# Patient Record
Sex: Male | Born: 1970 | Race: White | Hispanic: No | Marital: Single | Smoking: Never smoker
Health system: Southern US, Community
[De-identification: ages and names within clinical notes are randomized; demographics above are authoritative.]

## PROBLEM LIST (undated history)

## (undated) DIAGNOSIS — F1011 Alcohol abuse, in remission: Secondary | ICD-10-CM

---

## 1998-02-15 HISTORY — PX: KNEE SURGERY: SHX244

## 2009-01-29 ENCOUNTER — Emergency Department (HOSPITAL_COMMUNITY): Admission: EM | Admit: 2009-01-29 | Discharge: 2009-01-29 | Payer: Self-pay | Admitting: Emergency Medicine

## 2010-05-19 LAB — POCT I-STAT, CHEM 8
BUN: 6 mg/dL (ref 6–23)
Calcium, Ion: 1.05 mmol/L — ABNORMAL LOW (ref 1.12–1.32)
Chloride: 108 mEq/L (ref 96–112)
Creatinine, Ser: 0.6 mg/dL (ref 0.4–1.5)
Glucose, Bld: 103 mg/dL — ABNORMAL HIGH (ref 70–99)
HCT: 45 % (ref 39.0–52.0)
Hemoglobin: 15.3 g/dL (ref 13.0–17.0)
Potassium: 4.3 mEq/L (ref 3.5–5.1)
Sodium: 142 mEq/L (ref 135–145)
TCO2: 22 mmol/L (ref 0–100)

## 2013-05-10 ENCOUNTER — Telehealth: Payer: Self-pay | Admitting: Family Medicine

## 2013-05-10 NOTE — Telephone Encounter (Signed)
yes

## 2013-05-10 NOTE — Telephone Encounter (Signed)
Mom states she is a pt of yours and father is also. Would like to know if you would accept pt (their son)  as a pt?

## 2013-05-11 NOTE — Telephone Encounter (Signed)
appt scheduled

## 2013-05-17 ENCOUNTER — Encounter: Payer: Self-pay | Admitting: Family Medicine

## 2013-05-17 ENCOUNTER — Ambulatory Visit (INDEPENDENT_AMBULATORY_CARE_PROVIDER_SITE_OTHER): Payer: PRIVATE HEALTH INSURANCE | Admitting: Family Medicine

## 2013-05-17 VITALS — BP 140/90 | HR 112 | Temp 98.3°F | Ht 69.5 in | Wt 227.0 lb

## 2013-05-17 DIAGNOSIS — F524 Premature ejaculation: Secondary | ICD-10-CM

## 2013-05-17 DIAGNOSIS — R030 Elevated blood-pressure reading, without diagnosis of hypertension: Secondary | ICD-10-CM

## 2013-05-17 DIAGNOSIS — IMO0001 Reserved for inherently not codable concepts without codable children: Secondary | ICD-10-CM

## 2013-05-17 MED ORDER — FLUOXETINE HCL 10 MG PO CAPS: 10.0000 mg | ORAL_CAPSULE | Freq: Every day | ORAL | Status: DC

## 2013-05-17 NOTE — Progress Notes (Signed)
   Subjective:    Patient ID: Aaron Wilkerson, male    DOB: 10/07/1970, 43 y.o.   MRN: [id number]  HPI Patient is seen to establish care. Has not had a primary care physician in several years. No physical since he was in high school. He takes no regular medications. His fianc had recently been checking blood pressures and he had apparently systolic of 200 on one occasion. He's not had any headaches but has had some recent nosebleeds. He does not smoke but uses smokeless tobacco.  Patient does consume beer most days and generally 2-3 per day and occasionally binges up to 8 or 12 per day.  He does not consume much in the way of caffeine.  Other concern is issues with premature ejaculation. States about 6 years ago during intercourse felt a "popping sensation" in his penis and had somewhat of an altered in shape his penis early during intercourse but eventually this improved. He is not describing any classic Peyronie's disease. Does not describe any pain with intercourse. His major concern is premature ejaculation which has worsened over the past 2 years.  History reviewed. No pertinent past medical history. History reviewed. No pertinent past surgical history.  reports that he has never smoked. His smokeless tobacco use includes Chew. He reports that he drinks alcohol. He reports that he does not use illicit drugs. family history includes Heart disease in his maternal grandfather and maternal grandmother; Hyperlipidemia in his father; Hypertension in his father and mother. No Known Allergies    Review of Systems  Constitutional: Negative for fatigue.  HENT: Positive for nosebleeds.   Eyes: Negative for visual disturbance.  Respiratory: Negative for cough, chest tightness and shortness of breath.   Cardiovascular: Negative for chest pain, palpitations and leg swelling.  Neurological: Negative for dizziness, syncope, weakness, light-headedness and headaches.       Objective:   Physical Exam   Constitutional: He appears well-developed and well-nourished.  Neck: Neck supple. No thyromegaly present.  Cardiovascular: Normal rate and regular rhythm.   Pulmonary/Chest: Effort normal and breath sounds normal. No respiratory distress. He has no wheezes. He has no rales.  Musculoskeletal: He exhibits no edema.          Assessment & Plan:  #1 elevated blood pressure. Patient may have hypertension but we are not confirming readings comparable he has got a home recently. Continue to monitor closely next month. Work on weight loss. Reduce alcohol consumption. Reduce sodium consumption. Reassess with physical in one month.  #2 history of premature ejaculation. Trial of Prozac 10 mg once daily. Review at followup  #3 health maintenance.  Needs CPE and he will schedule.

## 2013-05-17 NOTE — Progress Notes (Signed)
Pre visit review using our clinic review tool, if applicable. No additional management support is needed unless otherwise documented below in the visit note. 

## 2013-05-17 NOTE — Patient Instructions (Signed)
Schedule complete physical. Try to lose some weight Scale back alcohol to no more than 2 beers per day.  Managing Your High Blood Pressure Blood pressure is a measurement of how forceful your blood is pressing against the walls of the arteries. Arteries are muscular tubes within the circulatory system. Blood pressure does not stay the same. Blood pressure rises when you are active, excited, or nervous; and it lowers during sleep and relaxation. If the numbers measuring your blood pressure stay above normal most of the time, you are at risk for health problems. High blood pressure (hypertension) is a long-term (chronic) condition in which blood pressure is elevated. A blood pressure reading is recorded as two numbers, such as 120 over 80 (or 120/80). The first, higher number is called the systolic pressure. It is a measure of the pressure in your arteries as the heart beats. The second, lower number is called the diastolic pressure. It is a measure of the pressure in your arteries as the heart relaxes between beats.  Keeping your blood pressure in a normal range is important to your overall health and prevention of health problems, such as heart disease and stroke. When your blood pressure is uncontrolled, your heart has to work harder than normal. High blood pressure is a very common condition in adults because blood pressure tends to rise with age. Men and women are equally likely to have hypertension but at different times in life. Before age 12, men are more likely to have hypertension. After 43 years of age, women are more likely to have it. Hypertension is especially common in African Americans. This condition often has no signs or symptoms. The cause of the condition is usually not known. Your caregiver can help you come up with a plan to keep your blood pressure in a normal, healthy range. BLOOD PRESSURE STAGES Blood pressure is classified into four stages: normal, prehypertension, stage 1, and stage  2. Your blood pressure reading will be used to determine what type of treatment, if any, is necessary. Appropriate treatment options are tied to these four stages:  Normal  Systolic pressure (mm Hg): below 120.  Diastolic pressure (mm Hg): below 80. Prehypertension  Systolic pressure (mm Hg): 120 to 139.  Diastolic pressure (mm Hg): 80 to 89. Stage1  Systolic pressure (mm Hg): 140 to 159.  Diastolic pressure (mm Hg): 90 to 99. Stage2  Systolic pressure (mm Hg): 160 or above.  Diastolic pressure (mm Hg): 100 or above. RISKS RELATED TO HIGH BLOOD PRESSURE Managing your blood pressure is an important responsibility. Uncontrolled high blood pressure can lead to:  A heart attack.  A stroke.  A weakened blood vessel (aneurysm).  Heart failure.  Kidney damage.  Eye damage.  Metabolic syndrome.  Memory and concentration problems. HOW TO MANAGE YOUR BLOOD PRESSURE Blood pressure can be managed effectively with lifestyle changes and medicines (if needed). Your caregiver will help you come up with a plan to bring your blood pressure within a normal range. Your plan should include the following: Education  Read all information provided by your caregivers about how to control blood pressure.  Educate yourself on the latest guidelines and treatment recommendations. New research is always being done to further define the risks and treatments for high blood pressure. Lifestylechanges  Control your weight.  Avoid smoking.  Stay physically active.  Reduce the amount of salt in your diet.  Reduce stress.  Control any chronic conditions, such as high cholesterol or diabetes.  Reduce your  alcohol intake. Medicines  Several medicines (antihypertensive medicines) are available, if needed, to bring blood pressure within a normal range. Communication  Review all the medicines you take with your caregiver because there may be side effects or interactions.  Talk with your  caregiver about your diet, exercise habits, and other lifestyle factors that may be contributing to high blood pressure.  See your caregiver regularly. Your caregiver can help you create and adjust your plan for managing high blood pressure. RECOMMENDATIONS FOR TREATMENT AND FOLLOW-UP  The following recommendations are based on current guidelines for managing high blood pressure in nonpregnant adults. Use these recommendations to identify the proper follow-up period or treatment option based on your blood pressure reading. You can discuss these options with your caregiver.  Systolic pressure of 120 to 139 or diastolic pressure of 80 to 89: Follow up with your caregiver as directed.  Systolic pressure of 140 to 160 or diastolic pressure of 90 to 100: Follow up with your caregiver within 2 months.  Systolic pressure above 160 or diastolic pressure above 100: Follow up with your caregiver within 1 month.  Systolic pressure above 180 or diastolic pressure above 110: Consider antihypertensive therapy; follow up with your caregiver within 1 week.  Systolic pressure above 200 or diastolic pressure above 120: Begin antihypertensive therapy; follow up with your caregiver within 1 week. Document Released: 10/27/2011 Document Reviewed: 10/27/2011 [facility] Patient Information 2014 [employer], [place].

## 2013-05-19 DIAGNOSIS — I10 Essential (primary) hypertension: Secondary | ICD-10-CM | POA: Insufficient documentation

## 2013-05-21 ENCOUNTER — Telehealth: Payer: Self-pay | Admitting: Family Medicine

## 2013-05-21 NOTE — Telephone Encounter (Signed)
Relevant patient education mailed to patient.  

## 2013-06-25 ENCOUNTER — Encounter: Payer: Self-pay | Admitting: Family Medicine

## 2013-06-25 ENCOUNTER — Ambulatory Visit (INDEPENDENT_AMBULATORY_CARE_PROVIDER_SITE_OTHER): Payer: PRIVATE HEALTH INSURANCE | Admitting: Family Medicine

## 2013-06-25 VITALS — BP 138/88 | HR 103 | Wt 226.0 lb

## 2013-06-25 DIAGNOSIS — F524 Premature ejaculation: Secondary | ICD-10-CM

## 2013-06-25 MED ORDER — FLUOXETINE HCL 20 MG PO TABS: 20.0000 mg | ORAL_TABLET | Freq: Every day | ORAL | Status: DC

## 2013-06-25 NOTE — Progress Notes (Signed)
Pre visit review using our clinic review tool, if applicable. No additional management support is needed unless otherwise documented below in the visit note. 

## 2013-06-25 NOTE — Patient Instructions (Signed)
Titrate Fluoxetine up to 2 tablets per day .   Try to lose some weight Continue to monitor blood pressure and be in touch if consistently > 140/90.

## 2013-06-25 NOTE — Progress Notes (Signed)
   Subjective:    Patient ID: Aaron Wilkerson, male    DOB: 1970-11-03, 43 y.o.   MRN: [id number]  HPI Patient seen for reevaluation regarding elevated blood pressure and premature ejaculation. We started fluoxetine 10 mg once daily and this has not helped his premature ejaculation symptoms. He denies any medication side effects. Not monitoring blood pressures regularly.  He has scaled back alcohol somewhat but still drinking about 4-5 beers every other day. No headaches. His wife is a Engineer, civil (consulting)nurse and she has been monitoring blood pressures periodically.  No past medical history on file. No past surgical history on file.  reports that he has never smoked. His smokeless tobacco use includes Chew. He reports that he drinks alcohol. He reports that he does not use illicit drugs. family history includes Heart disease in his maternal grandfather and maternal grandmother; Hyperlipidemia in his father; Hypertension in his father and mother. No Known Allergies    Review of Systems  Constitutional: Negative for fatigue.  Eyes: Negative for visual disturbance.  Respiratory: Negative for cough, chest tightness and shortness of breath.   Cardiovascular: Negative for chest pain, palpitations and leg swelling.  Neurological: Negative for dizziness, syncope, weakness, light-headedness and headaches.       Objective:   Physical Exam  Constitutional: He is oriented to person, place, and time. He appears well-developed and well-nourished.  HENT:  Right Ear: External ear normal.  Left Ear: External ear normal.  Mouth/Throat: Oropharynx is clear and moist.  Eyes: Pupils are equal, round, and reactive to light.  Neck: Neck supple. No thyromegaly present.  Cardiovascular: Normal rate and regular rhythm.   Pulmonary/Chest: Effort normal and breath sounds normal. No respiratory distress. He has no wheezes. He has no rales.  Musculoskeletal: He exhibits no edema.  Neurological: He is alert and oriented to  person, place, and time.          Assessment & Plan:  #1 elevated blood pressure. Borderline elevation. We've suggested continued monitoring, weight loss, scaled back alcohol further. Reassess 3 months. #2 premature ejaculation. Titrate fluoxetine 20 mg once daily.

## 2015-11-04 ENCOUNTER — Emergency Department (HOSPITAL_COMMUNITY)
Admission: EM | Admit: 2015-11-04 | Discharge: 2015-11-04 | Disposition: A | Discharge status: Home / Self Care | Payer: BLUE CROSS/BLUE SHIELD | Attending: Emergency Medicine | Admitting: Emergency Medicine

## 2015-11-04 ENCOUNTER — Encounter (HOSPITAL_COMMUNITY): Payer: Self-pay | Admitting: Emergency Medicine

## 2015-11-04 ENCOUNTER — Emergency Department (HOSPITAL_COMMUNITY): Payer: BLUE CROSS/BLUE SHIELD

## 2015-11-04 DIAGNOSIS — F17220 Nicotine dependence, chewing tobacco, uncomplicated: Secondary | ICD-10-CM | POA: Insufficient documentation

## 2015-11-04 DIAGNOSIS — R079 Chest pain, unspecified: Secondary | ICD-10-CM | POA: Diagnosis not present

## 2015-11-04 DIAGNOSIS — R0602 Shortness of breath: Secondary | ICD-10-CM | POA: Diagnosis not present

## 2015-11-04 DIAGNOSIS — F1010 Alcohol abuse, uncomplicated: Secondary | ICD-10-CM | POA: Insufficient documentation

## 2015-11-04 LAB — BASIC METABOLIC PANEL
Anion gap: 10 (ref 5–15)
BUN: 11 mg/dL (ref 6–20)
CO2: 23 mmol/L (ref 22–32)
Calcium: 9.5 mg/dL (ref 8.9–10.3)
Chloride: 106 mmol/L (ref 101–111)
Creatinine, Ser: 0.6 mg/dL — ABNORMAL LOW (ref 0.61–1.24)
GFR calc Af Amer: >60 mL/min (ref >60)
GFR calc non Af Amer: >60 mL/min (ref >60)
Glucose, Bld: 102 mg/dL — ABNORMAL HIGH (ref 65–99)
Potassium: 3.5 mmol/L (ref 3.5–5.1)
Sodium: 139 mmol/L (ref 135–145)

## 2015-11-04 LAB — ETHANOL: Alcohol, Ethyl (B): <5 mg/dL (ref <5)

## 2015-11-04 LAB — CBC
HCT: 44 % (ref 39.0–52.0)
Hemoglobin: 14.9 g/dL (ref 13.0–17.0)
MCH: 32.1 pg (ref 26.0–34.0)
MCHC: 33.9 g/dL (ref 30.0–36.0)
MCV: 94.8 fL (ref 78.0–100.0)
Platelets: 127 10*3/uL — ABNORMAL LOW (ref 150–400)
RBC: 4.64 MIL/uL (ref 4.22–5.81)
RDW: 11.9 % (ref 11.5–15.5)
WBC: 8 10*3/uL (ref 4.0–10.5)

## 2015-11-04 LAB — I-STAT TROPONIN, ED: Troponin i, poc: 0 ng/mL (ref 0.00–0.08)

## 2015-11-04 MED ORDER — PROMETHAZINE HCL 25 MG PO TABS: 25.0000 mg | ORAL_TABLET | Freq: Four times a day (QID) | ORAL | 0 refills | Status: DC | PRN

## 2015-11-04 MED ORDER — SODIUM CHLORIDE 0.9 % IV BOLUS (SEPSIS)
1000.0000 mL | Freq: Once | INTRAVENOUS | Status: AC
  Administered 2015-11-04: 1000 mL via INTRAVENOUS

## 2015-11-04 MED ORDER — LORAZEPAM 2 MG/ML IJ SOLN
1.0000 mg | Freq: Once | INTRAMUSCULAR | Status: AC
  Administered 2015-11-04: 1 mg via INTRAVENOUS
  Filled 2015-11-04: qty 1

## 2015-11-04 MED ORDER — LORAZEPAM 1 MG PO TABS: 1.0000 mg | ORAL_TABLET | Freq: Three times a day (TID) | ORAL | 0 refills | Status: DC | PRN

## 2015-11-04 MED ORDER — ONDANSETRON HCL 4 MG/2ML IJ SOLN
4.0000 mg | Freq: Once | INTRAMUSCULAR | Status: AC
  Administered 2015-11-04: 4 mg via INTRAVENOUS
  Filled 2015-11-04: qty 2

## 2015-11-04 NOTE — Discharge Instructions (Signed)
Strongly recommend Alcoholics Anonymous. Prescription for nerve and nausea medicine. Increase fluids. Eat a good balanced diet.

## 2015-11-04 NOTE — ED Triage Notes (Signed)
Pt c/o CP on exertion, shakiness, nausea, bile regurgitation, SOB. Pt usually drinks 24 beers per day, stopped on Sunday.

## 2015-11-04 NOTE — ED Provider Notes (Signed)
[facility]-EMERGENCY DEPT Provider Note   CSN: [id number] Arrival date & time: 11/04/15  1743     History   Chief Complaint Chief Complaint  Patient presents with  . Chest Pain  . Alcohol Problem    detox    HPI Aaron Wilkerson is a 45 y.o. male.  Patient has a long-term drinking problem. He drinks up to a case of beer a day. Last alcohol on Sunday. He claims having no other health problems. He feels agitated and nervous. No street drugs. Complains of vague chest pain.      History reviewed. No pertinent past medical history.  Patient Active Problem List   Diagnosis Date Noted  . Premature ejaculation 06/25/2013  . Elevated blood pressure 05/19/2013    History reviewed. No pertinent surgical history.     Home Medications    Prior to Admission medications   Medication Sig Start Date End Date Taking? Authorizing Provider  FLUoxetine (PROZAC) 20 MG tablet Take 1 tablet (20 mg total) by mouth daily. Patient not taking: Reported on 11/04/2015 06/25/13   Kristian CoveyBruce W Burchette, MD  LORazepam (ATIVAN) 1 MG tablet Take 1 tablet (1 mg total) by mouth 3 (three) times daily as needed for anxiety. 11/04/15   Donnetta HutchingBrian Lacresha Fusilier, MD  promethazine (PHENERGAN) 25 MG tablet Take 1 tablet (25 mg total) by mouth every 6 (six) hours as needed for nausea or vomiting. 11/04/15   Donnetta HutchingBrian Taylour Lietzke, MD    Family History Family History  Problem Relation Age of Onset  . Hypertension Mother   . Hypertension Father   . Hyperlipidemia Father   . Heart disease Maternal Grandmother   . Heart disease Maternal Grandfather     Social History Social History  Substance Use Topics  . Smoking status: Never Smoker  . Smokeless tobacco: Current User    Types: Chew  . Alcohol use 100.8 oz/week    168 Cans of beer per week     Allergies   Review of patient's allergies indicates no known allergies.   Review of Systems Review of Systems  All other systems reviewed and are negative.    Physical Exam Updated  Vital Signs BP 120/93   Pulse 71   Resp 13   SpO2 99%   Physical Exam  Constitutional: He is oriented to person, place, and time.  Slight tremulousness, but no acute distress  HENT:  Head: Normocephalic and atraumatic.  Eyes: Conjunctivae are normal.  Neck: Neck supple.  Cardiovascular: Normal rate and regular rhythm.   Pulmonary/Chest: Effort normal and breath sounds normal.  Abdominal: Soft. Bowel sounds are normal.  Musculoskeletal: Normal range of motion.  Neurological: He is alert and oriented to person, place, and time.  Skin: Skin is warm and dry.  Psychiatric: He has a normal mood and affect. His behavior is normal.  Nursing note and vitals reviewed.    ED Treatments / Results  Labs (all labs ordered are listed, but only abnormal results are displayed) Labs Reviewed  BASIC METABOLIC PANEL - Abnormal; Notable for the following:       Result Value   Glucose, Bld 102 (*)    Creatinine, Ser 0.60 (*)    All other components within normal limits  CBC - Abnormal; Notable for the following:    Platelets 127 (*)    All other components within normal limits  ETHANOL  I-STAT TROPOININ, ED    EKG  EKG Interpretation None       Radiology Dg Chest 2 View  Result Date: 11/04/2015 CLINICAL DATA:  Acute onset of generalized chest pain on exertion. Shakiness and nausea. Shortness of breath. Initial encounter. EXAM: CHEST  2 VIEW COMPARISON:  None. FINDINGS: The lungs are well-aerated and clear. There is no evidence of focal opacification, pleural effusion or pneumothorax. The heart is normal in size; the mediastinal contour is within normal limits. No acute osseous abnormalities are seen. IMPRESSION: No acute cardiopulmonary process seen. Electronically Signed   By: Roanna Raider M.D.   On: 11/04/2015 19:16    Procedures Procedures (including critical care time)  Medications Ordered in ED Medications  sodium chloride 0.9 % bolus 1,000 mL (1,000 mLs Intravenous New  Bag/Given 11/04/15 2000)  ondansetron (ZOFRAN) injection 4 mg (4 mg Intravenous Given 11/04/15 2003)  sodium chloride 0.9 % bolus 1,000 mL (1,000 mLs Intravenous New Bag/Given 11/04/15 2000)  LORazepam (ATIVAN) injection 1 mg (1 mg Intravenous Given 11/04/15 2005)     Initial Impression / Assessment and Plan / ED Course  I have reviewed the triage vital signs and the nursing notes.  Pertinent labs & imaging results that were available during my care of the patient were reviewed by me and considered in my medical decision making (see chart for details).  Clinical Course    Patient has a normal physical exam. Screening labs were within normal limits. He feels better after IV fluids and IV Zofran, IV Ativan. Discussed his drinking problem. Discharge medications Ativan 1 mg and Phenergan 25 mg  Final Clinical Impressions(s) / ED Diagnoses   Final diagnoses:  Alcohol abuse    New Prescriptions New Prescriptions   LORAZEPAM (ATIVAN) 1 MG TABLET    Take 1 tablet (1 mg total) by mouth 3 (three) times daily as needed for anxiety.   PROMETHAZINE (PHENERGAN) 25 MG TABLET    Take 1 tablet (25 mg total) by mouth every 6 (six) hours as needed for nausea or vomiting.     Donnetta Hutching, MD 11/04/15 [phone]

## 2015-11-27 DIAGNOSIS — Z23 Encounter for immunization: Secondary | ICD-10-CM | POA: Diagnosis not present

## 2016-03-03 ENCOUNTER — Encounter (HOSPITAL_COMMUNITY): Payer: Self-pay

## 2016-03-03 ENCOUNTER — Inpatient Hospital Stay (HOSPITAL_COMMUNITY)
Admission: EM | Admit: 2016-03-03 | Discharge: 2016-03-10 | DRG: 378 | Disposition: A | Discharge status: Home / Self Care | Payer: BLUE CROSS/BLUE SHIELD | Attending: Family Medicine | Admitting: Family Medicine

## 2016-03-03 DIAGNOSIS — K2980 Duodenitis without bleeding: Secondary | ICD-10-CM | POA: Diagnosis present

## 2016-03-03 DIAGNOSIS — G4730 Sleep apnea, unspecified: Secondary | ICD-10-CM | POA: Diagnosis not present

## 2016-03-03 DIAGNOSIS — S0990XA Unspecified injury of head, initial encounter: Secondary | ICD-10-CM | POA: Diagnosis not present

## 2016-03-03 DIAGNOSIS — Z8249 Family history of ischemic heart disease and other diseases of the circulatory system: Secondary | ICD-10-CM | POA: Diagnosis not present

## 2016-03-03 DIAGNOSIS — K648 Other hemorrhoids: Secondary | ICD-10-CM | POA: Diagnosis not present

## 2016-03-03 DIAGNOSIS — H5500 Unspecified nystagmus: Secondary | ICD-10-CM | POA: Diagnosis present

## 2016-03-03 DIAGNOSIS — K219 Gastro-esophageal reflux disease without esophagitis: Secondary | ICD-10-CM | POA: Diagnosis not present

## 2016-03-03 DIAGNOSIS — T39395A Adverse effect of other nonsteroidal anti-inflammatory drugs [NSAID], initial encounter: Secondary | ICD-10-CM | POA: Diagnosis not present

## 2016-03-03 DIAGNOSIS — F419 Anxiety disorder, unspecified: Secondary | ICD-10-CM

## 2016-03-03 DIAGNOSIS — K259 Gastric ulcer, unspecified as acute or chronic, without hemorrhage or perforation: Secondary | ICD-10-CM | POA: Diagnosis not present

## 2016-03-03 DIAGNOSIS — K649 Unspecified hemorrhoids: Secondary | ICD-10-CM | POA: Diagnosis not present

## 2016-03-03 DIAGNOSIS — R55 Syncope and collapse: Secondary | ICD-10-CM

## 2016-03-03 DIAGNOSIS — I10 Essential (primary) hypertension: Secondary | ICD-10-CM | POA: Diagnosis present

## 2016-03-03 DIAGNOSIS — K2960 Other gastritis without bleeding: Secondary | ICD-10-CM | POA: Diagnosis not present

## 2016-03-03 DIAGNOSIS — K5731 Diverticulosis of large intestine without perforation or abscess with bleeding: Secondary | ICD-10-CM

## 2016-03-03 DIAGNOSIS — R42 Dizziness and giddiness: Secondary | ICD-10-CM

## 2016-03-03 DIAGNOSIS — Z8719 Personal history of other diseases of the digestive system: Secondary | ICD-10-CM | POA: Diagnosis not present

## 2016-03-03 DIAGNOSIS — K625 Hemorrhage of anus and rectum: Secondary | ICD-10-CM

## 2016-03-03 DIAGNOSIS — K5730 Diverticulosis of large intestine without perforation or abscess without bleeding: Secondary | ICD-10-CM | POA: Diagnosis not present

## 2016-03-03 DIAGNOSIS — D62 Acute posthemorrhagic anemia: Secondary | ICD-10-CM | POA: Diagnosis not present

## 2016-03-03 DIAGNOSIS — K922 Gastrointestinal hemorrhage, unspecified: Secondary | ICD-10-CM | POA: Diagnosis not present

## 2016-03-03 DIAGNOSIS — K3189 Other diseases of stomach and duodenum: Secondary | ICD-10-CM | POA: Diagnosis not present

## 2016-03-03 DIAGNOSIS — F411 Generalized anxiety disorder: Secondary | ICD-10-CM | POA: Diagnosis not present

## 2016-03-03 DIAGNOSIS — F17220 Nicotine dependence, chewing tobacco, uncomplicated: Secondary | ICD-10-CM | POA: Diagnosis not present

## 2016-03-03 DIAGNOSIS — R404 Transient alteration of awareness: Secondary | ICD-10-CM | POA: Diagnosis not present

## 2016-03-03 DIAGNOSIS — K5791 Diverticulosis of intestine, part unspecified, without perforation or abscess with bleeding: Secondary | ICD-10-CM | POA: Diagnosis not present

## 2016-03-03 DIAGNOSIS — F1011 Alcohol abuse, in remission: Secondary | ICD-10-CM | POA: Diagnosis not present

## 2016-03-03 HISTORY — DX: Alcohol abuse, in remission: F10.11

## 2016-03-03 LAB — CBC WITH DIFFERENTIAL/PLATELET
Basophils Absolute: 0 10*3/uL (ref 0.0–0.1)
Basophils Relative: 0 %
Eosinophils Absolute: 0 10*3/uL (ref 0.0–0.7)
Eosinophils Relative: 0 %
HCT: 38.3 % — ABNORMAL LOW (ref 39.0–52.0)
Hemoglobin: 13.1 g/dL (ref 13.0–17.0)
Lymphocytes Relative: 11 %
Lymphs Abs: 1.3 10*3/uL (ref 0.7–4.0)
MCH: 31.3 pg (ref 26.0–34.0)
MCHC: 34.2 g/dL (ref 30.0–36.0)
MCV: 91.6 fL (ref 78.0–100.0)
Monocytes Absolute: 0.9 10*3/uL (ref 0.1–1.0)
Monocytes Relative: 7 %
Neutro Abs: 10.3 10*3/uL — ABNORMAL HIGH (ref 1.7–7.7)
Neutrophils Relative %: 82 %
Platelets: 158 10*3/uL (ref 150–400)
RBC: 4.18 MIL/uL — ABNORMAL LOW (ref 4.22–5.81)
RDW: 12.4 % (ref 11.5–15.5)
WBC: 12.6 10*3/uL — ABNORMAL HIGH (ref 4.0–10.5)

## 2016-03-03 LAB — OCCULT BLOOD X 1 CARD TO LAB, STOOL: Fecal Occult Bld: POSITIVE — AB

## 2016-03-03 LAB — ABO/RH: ABO/RH(D): O POS

## 2016-03-03 LAB — COMPREHENSIVE METABOLIC PANEL
ALT: 26 U/L (ref 17–63)
AST: 32 U/L (ref 15–41)
Albumin: 4.2 g/dL (ref 3.5–5.0)
Alkaline Phosphatase: 101 U/L (ref 38–126)
Anion gap: 8 (ref 5–15)
BUN: 20 mg/dL (ref 6–20)
CO2: 25 mmol/L (ref 22–32)
Calcium: 9.2 mg/dL (ref 8.9–10.3)
Chloride: 107 mmol/L (ref 101–111)
Creatinine, Ser: 0.74 mg/dL (ref 0.61–1.24)
GFR calc Af Amer: >60 mL/min (ref >60)
GFR calc non Af Amer: >60 mL/min (ref >60)
Glucose, Bld: 121 mg/dL — ABNORMAL HIGH (ref 65–99)
Potassium: 3.8 mmol/L (ref 3.5–5.1)
Sodium: 140 mmol/L (ref 135–145)
Total Bilirubin: 0.8 mg/dL (ref 0.3–1.2)
Total Protein: 7.1 g/dL (ref 6.5–8.1)

## 2016-03-03 LAB — TYPE AND SCREEN
ABO/RH(D): O POS
Antibody Screen: NEGATIVE

## 2016-03-03 LAB — PROTIME-INR
INR: 1.17
Prothrombin Time: 15 seconds (ref 11.4–15.2)

## 2016-03-03 LAB — CBG MONITORING, ED: Glucose-Capillary: 106 mg/dL — ABNORMAL HIGH (ref 65–99)

## 2016-03-03 LAB — ETHANOL: Alcohol, Ethyl (B): <5 mg/dL (ref <5)

## 2016-03-03 LAB — HEMOGLOBIN AND HEMATOCRIT, BLOOD
HCT: 33 % — ABNORMAL LOW (ref 39.0–52.0)
Hemoglobin: 11.3 g/dL — ABNORMAL LOW (ref 13.0–17.0)

## 2016-03-03 MED ORDER — SODIUM CHLORIDE 0.9% FLUSH
3.0000 mL | Freq: Two times a day (BID) | INTRAVENOUS | Status: DC
  Administered 2016-03-03 – 2016-03-09 (×6): 3 mL via INTRAVENOUS

## 2016-03-03 MED ORDER — DEXTROSE-NACL 5-0.9 % IV SOLN
INTRAVENOUS | Status: DC
  Administered 2016-03-03 – 2016-03-04 (×2): via INTRAVENOUS

## 2016-03-03 MED ORDER — PEG-KCL-NACL-NASULF-NA ASC-C 100 G PO SOLR: 1.0000 | Freq: Once | ORAL | Status: DC

## 2016-03-03 MED ORDER — ONDANSETRON HCL 4 MG PO TABS
4.0000 mg | ORAL_TABLET | Freq: Four times a day (QID) | ORAL | Status: DC | PRN
  Administered 2016-03-06 – 2016-03-08 (×2): 4 mg via ORAL
  Filled 2016-03-03 (×2): qty 1

## 2016-03-03 MED ORDER — ACETAMINOPHEN 325 MG PO TABS
650.0000 mg | ORAL_TABLET | Freq: Four times a day (QID) | ORAL | Status: DC | PRN
  Administered 2016-03-06 – 2016-03-07 (×3): 650 mg via ORAL
  Filled 2016-03-03 (×3): qty 2

## 2016-03-03 MED ORDER — SODIUM CHLORIDE 0.9 % IV SOLN
Freq: Once | INTRAVENOUS | Status: AC
  Administered 2016-03-03: 17:00:00 via INTRAVENOUS

## 2016-03-03 MED ORDER — PANTOPRAZOLE SODIUM 40 MG PO TBEC: 40.0000 mg | DELAYED_RELEASE_TABLET | Freq: Two times a day (BID) | ORAL | Status: DC

## 2016-03-03 MED ORDER — PANTOPRAZOLE SODIUM 40 MG IV SOLR
40.0000 mg | Freq: Two times a day (BID) | INTRAVENOUS | Status: DC
  Administered 2016-03-03 – 2016-03-05 (×3): 40 mg via INTRAVENOUS
  Filled 2016-03-03 (×3): qty 40

## 2016-03-03 MED ORDER — PEG-KCL-NACL-NASULF-NA ASC-C 100 G PO SOLR
0.5000 | Freq: Once | ORAL | Status: AC
  Administered 2016-03-03: 100 g via ORAL
  Filled 2016-03-03: qty 1

## 2016-03-03 MED ORDER — PANTOPRAZOLE SODIUM 40 MG IV SOLR: 40.0000 mg | Freq: Two times a day (BID) | INTRAVENOUS | Status: DC

## 2016-03-03 MED ORDER — CLONAZEPAM 0.5 MG PO TABS
0.5000 mg | ORAL_TABLET | Freq: Two times a day (BID) | ORAL | Status: DC | PRN
  Administered 2016-03-06 – 2016-03-10 (×5): 0.5 mg via ORAL
  Filled 2016-03-03 (×6): qty 1

## 2016-03-03 MED ORDER — ONDANSETRON HCL 4 MG/2ML IJ SOLN
4.0000 mg | Freq: Four times a day (QID) | INTRAMUSCULAR | Status: DC | PRN
  Administered 2016-03-04 – 2016-03-08 (×2): 4 mg via INTRAVENOUS
  Filled 2016-03-03: qty 2

## 2016-03-03 MED ORDER — ACETAMINOPHEN 650 MG RE SUPP: 650.0000 mg | Freq: Four times a day (QID) | RECTAL | Status: DC | PRN

## 2016-03-03 MED ORDER — VITAMIN B-1 100 MG PO TABS
100.0000 mg | ORAL_TABLET | Freq: Every day | ORAL | Status: DC
  Administered 2016-03-03 – 2016-03-10 (×7): 100 mg via ORAL
  Filled 2016-03-03 (×7): qty 1

## 2016-03-03 MED ORDER — PEG-KCL-NACL-NASULF-NA ASC-C 100 G PO SOLR
0.5000 | Freq: Once | ORAL | Status: AC
  Administered 2016-03-04: 100 g via ORAL
  Filled 2016-03-03: qty 1

## 2016-03-03 NOTE — H&P (Signed)
History and Physical    Aaron Wilkerson ZOX:[id number] DOB: Sep 15, 1970 DOA: 03/03/2016  PCP: Kristian Covey, MD   Patient coming from: Home   Chief Complaint: Rectal bleeding.   HPI: Aaron Wilkerson is a 46 y.o. male with no significant medical history, who presents to the hospital chief complaint of rectal bleeding. Approximately 10 AM this morning he noticed significant bright red blood per rectum, it has been persistent, with no improving or worsening factors, it has been associated with nausea, mild lower abdominal pain, dizziness and orthostatic symptoms. Patient suffered a syncope while going from his bedroom to the bathroom, experiencing head trauma. So far patient had at least 3 large bloody bowel movements. Due to his persistent symptoms he came to the hospital for further evaluation.  ED Course: Initial assessment, patient hemodynamically stable, due to orthostatic symptoms referred for further hospitalization.   Review of Systems: 1. General. No fevers or chills, no waking a weight loss 2. ENT. No runny nose or sore throat 3. Pulmonary no shortness of breath cough or hemoptysis 4. Cardiovascular, no angina, no claudication, no PND orthopnea 5. Gastrointestinal no nausea vomiting or diarrhea, positive abdominal pain, bright red blood per rectum. 6. Skeletal no joint pain 7. Hematology no easy bruisability or frequent infections 8. Endocrine a tremors, heat or cold intolerance 9. Urology no dysuria or increased urinary frequency 10. Dermatology no rashes   History reviewed. No pertinent past medical history.  History reviewed. No pertinent surgical history.   reports that he has never smoked. His smokeless tobacco use includes Chew. He reports that he drinks about 100.8 oz of alcohol per week . He reports that he does not use drugs.  No Known Allergies  Family History  Problem Relation Age of Onset  . Hypertension Mother   . Hypertension Father   . Hyperlipidemia  Father   . Heart disease Maternal Grandmother   . Heart disease Maternal Grandfather    Unacceptable: Noncontributory, unremarkable, or negative. Acceptable: Family history reviewed and not pertinent (If you reviewed it)  Prior to Admission medications   Medication Sig Start Date End Date Taking? Authorizing Provider  Aspirin-Salicylamide-Caffeine (BC FAST PAIN RELIEF) 650-195-33.3 MG PACK Take 1 packet by mouth daily as needed (for pain).   Yes Historical Provider, MD  clonazePAM (KLONOPIN) 0.5 MG tablet Take 0.5 mg by mouth 2 (two) times daily as needed for anxiety.   Yes Historical Provider, MD  Thiamine HCl (VITAMIN B-1 PO) Take 1 tablet by mouth daily.   Yes Historical Provider, MD  FLUoxetine (PROZAC) 20 MG tablet Take 1 tablet (20 mg total) by mouth daily. Patient not taking: Reported on 03/03/2016 06/25/13   Kristian Covey, MD  LORazepam (ATIVAN) 1 MG tablet Take 1 tablet (1 mg total) by mouth 3 (three) times daily as needed for anxiety. Patient not taking: Reported on 03/03/2016 11/04/15   Donnetta Hutching, MD  promethazine (PHENERGAN) 25 MG tablet Take 1 tablet (25 mg total) by mouth every 6 (six) hours as needed for nausea or vomiting. Patient not taking: Reported on 03/03/2016 11/04/15   Donnetta Hutching, MD    Physical Exam: Vitals:   03/03/16 1351 03/03/16 1357 03/03/16 1358 03/03/16 1501  BP:  133/84  109/69  Pulse:  95  91  Resp:  15  10  Temp:  98.4 F (36.9 C)    TempSrc:  Oral    SpO2: 100% 100%  100%  Weight:   90.7 kg (200 lb)   Height:  5\' 10"  (1.778 m)       Constitutional: deconditioned.  Vitals:   03/03/16 1351 03/03/16 1357 03/03/16 1358 03/03/16 1501  BP:  133/84  109/69  Pulse:  95  91  Resp:  15  10  Temp:  98.4 F (36.9 C)    TempSrc:  Oral    SpO2: 100% 100%  100%  Weight:   90.7 kg (200 lb)   Height:   5\' 10"  (1.778 m)    Eyes: PERRL, lids and conjunctivae mild pale. No icterus.  Head normocephalic, nose and ears no deformities.  ENMT: Mucous  membranes are dry. Posterior pharynx clear of any exudate or lesions.Normal dentition.  Neck: normal, supple, no masses, no thyromegaly Respiratory: bilaterally, no wheezing, no crackles. Normal respiratory effort. No accessory muscle use.  Cardiovascular: Regular rate and rhythm, no murmurs / rubs / gallops. No extremity edema. 2+ pedal pulses. No carotid bruits.  Abdomen: no tenderness, no masses palpated. No hepatosplenomegaly. Bowel sounds positive.  Musculoskeletal: no clubbing / cyanosis. No joint deformity upper and lower extremities. Good ROM, no contractures. Normal muscle tone.  Skin: no rashes, lesions, ulcers. No induration Neurologic: CN 2-12 grossly intact. Sensation intact, DTR normal. Strength 5/5 in all 4.    Labs on Admission: I have personally reviewed following labs and imaging studies  CBC:  Recent Labs Lab 03/03/16 1416  WBC 12.6*  NEUTROABS 10.3*  HGB 13.1  HCT 38.3*  MCV 91.6  PLT 158   Basic Metabolic Panel:  Recent Labs Lab 03/03/16 1416  NA 140  K 3.8  CL 107  CO2 25  GLUCOSE 121*  BUN 20  CREATININE 0.74  CALCIUM 9.2   GFR: Estimated Creatinine Clearance: 132.1 mL/min (by C-G formula based on SCr of 0.74 mg/dL). Liver Function Tests:  Recent Labs Lab 03/03/16 1416  AST 32  ALT 26  ALKPHOS 101  BILITOT 0.8  PROT 7.1  ALBUMIN 4.2   No results for input(s): LIPASE, AMYLASE in the last 168 hours. No results for input(s): AMMONIA in the last 168 hours. Coagulation Profile:  Recent Labs Lab 03/03/16 1416  INR 1.17   Cardiac Enzymes: No results for input(s): CKTOTAL, CKMB, CKMBINDEX, TROPONINI in the last 168 hours. BNP (last 3 results) No results for input(s): PROBNP in the last 8760 hours. HbA1C: No results for input(s): HGBA1C in the last 72 hours. CBG:  Recent Labs Lab 03/03/16 1426  GLUCAP 106*   Lipid Profile: No results for input(s): CHOL, HDL, LDLCALC, TRIG, CHOLHDL, LDLDIRECT in the last 72 hours. Thyroid  Function Tests: No results for input(s): TSH, T4TOTAL, FREET4, T3FREE, THYROIDAB in the last 72 hours. Anemia Panel: No results for input(s): VITAMINB12, FOLATE, FERRITIN, TIBC, IRON, RETICCTPCT in the last 72 hours. Urine analysis: No results found for: COLORURINE, APPEARANCEUR, LABSPEC, PHURINE, GLUCOSEU, HGBUR, BILIRUBINUR, KETONESUR, PROTEINUR, UROBILINOGEN, NITRITE, LEUKOCYTESUR Sepsis Labs: !!!!!!!!!!!!!!!!!!!!!!!!!!!!!!!!!!!!!!!!!!!! @LABRCNTIP (procalcitonin:4,lacticidven:4) )No results found for this or any previous visit (from the past 240 hour(s)).   Radiological Exams on Admission: No results found.  EKG: Independently reviewed. Normal sinus rhythm.  Assessment/Plan Active Problems:   Rectal bleeding   This is a 46 year old male with no significant past medical history who presents with bright red blood per rectum. Multiple bowel movements, complicated by orthostatic symptoms and syncope. On initial physical examination blood pressure 109/69, heart rate 91, respiratory rate 10, oxygen saturation 100%. Oral mucosa is moist, lungs were clear to auscultation bilaterally, heart S1-S2 present rhythmic, abdomen soft and nontender, nondistended, lower  extremities  no edema. Sodium 140, potassium 3.8, chloride 17, bicarbonate 25, glucose 121, BUN 20, creatinine 0.74, white count 12.6, hemoglobin 13.1, hematocrit 38.3, platelets 158.   The patient will be admitted to the hospital with the working diagnosis of acute lower GI bleed with impending acute blood loss anemia.  1. Acute lower GI bleed. Likely lower GI bleed, considering lack of significant abdominal pain, diverticulosis is a diagnostic possibility. Will continue to check H&H every 6 hours, supportive care with IV fluids, clear liquid diet. Will add proton pump inhibitor and follow-up with GI recommendations, patient may need endoscopic procedure on his hospitalization.  2. Anxiety. We'll continue lorazepam, fluoxetine,  clonazepam.   DVT prophylaxis:  SCD  Code Status:  Full   Family Communication: I spoke with patient's wife at the bedside and all questions were addressed. Disposition Plan:  Home  Consults called: GI  Admission status:  Inpatient   Rhylin Venters Annett Gulaaniel Ballard Budney MD [facility] Pager [phone]- [phone]  If 7PM-7AM, please contact night-coverage [email or web address] Password [email or web address]  03/03/2016, 4:29 PM

## 2016-03-03 NOTE — ED Notes (Signed)
PER MARTHA, RN, PT UNABLE TO TOLERATE NG TUBE PLACEMENT. GI PA JESSICA MADE AWARE.

## 2016-03-03 NOTE — ED Notes (Signed)
When helping RN with NG tube family member requested to stay in the room. Pt's family insisted that we stop.

## 2016-03-03 NOTE — ED Provider Notes (Signed)
[facility]-EMERGENCY DEPT Provider Note   CSN: [id number] Arrival date & time: 03/03/16  1334     History   Chief Complaint Chief Complaint  Patient presents with  . Rectal Bleeding    HPI Aaron AlarJames L Nugent is a 46 y.o. male.  HPI   Patient is overall a healthy 46 year old male comes to the ER for rectal bleeding. He woke up feeling fine and had the urge to have a bowel movement, had a large blood clot come out, went to take a nap and shortly woke up again with the urge to have another bowel movement, this time passing running RBR per rectum, he went to tell his wife downstairs and had another urge for a bowel movement, he reports getting to the bathroom and having a syncopal episode, hitting his head in the bathroom and passing out per wife and passing blood on the bathroom floor. EMS was called. EMS gave him some fluids. The patient is awake and alert. Denies having any abdominal pain but still has the urge for bowel movement. Still actively passing blood slowly.  Vital signs are stable. Patients parents and wife are here.  History reviewed. No pertinent past medical history.  Patient Active Problem List   Diagnosis Date Noted  . Rectal bleeding 03/03/2016  . Premature ejaculation 06/25/2013  . Elevated blood pressure 05/19/2013    History reviewed. No pertinent surgical history.     Home Medications    Prior to Admission medications   Medication Sig Start Date End Date Taking? Authorizing Provider  Aspirin-Salicylamide-Caffeine (BC FAST PAIN RELIEF) 650-195-33.3 MG PACK Take 1 packet by mouth daily as needed (for pain).   Yes Historical Provider, MD  clonazePAM (KLONOPIN) 0.5 MG tablet Take 0.5 mg by mouth 2 (two) times daily as needed for anxiety.   Yes Historical Provider, MD  Thiamine HCl (VITAMIN B-1 PO) Take 1 tablet by mouth daily.   Yes Historical Provider, MD  FLUoxetine (PROZAC) 20 MG tablet Take 1 tablet (20 mg total) by mouth daily. Patient not taking: Reported on  03/03/2016 06/25/13   Kristian CoveyBruce W Burchette, MD  LORazepam (ATIVAN) 1 MG tablet Take 1 tablet (1 mg total) by mouth 3 (three) times daily as needed for anxiety. Patient not taking: Reported on 03/03/2016 11/04/15   Donnetta HutchingBrian Cook, MD  promethazine (PHENERGAN) 25 MG tablet Take 1 tablet (25 mg total) by mouth every 6 (six) hours as needed for nausea or vomiting. Patient not taking: Reported on 03/03/2016 11/04/15   Donnetta HutchingBrian Cook, MD    Family History Family History  Problem Relation Age of Onset  . Hypertension Mother   . Hypertension Father   . Hyperlipidemia Father   . Heart disease Maternal Grandmother   . Heart disease Maternal Grandfather     Social History Social History  Substance Use Topics  . Smoking status: Never Smoker  . Smokeless tobacco: Current User    Types: Chew  . Alcohol use 100.8 oz/week    168 Cans of beer per week     Allergies   Patient has no known allergies.   Review of Systems Review of Systems Review of Systems All other systems negative except as documented in the HPI. All pertinent positives and negatives as reviewed in the HPI.   Physical Exam Updated Vital Signs BP 109/69   Pulse 91   Temp 98.4 F (36.9 C) (Oral)   Resp 10   Ht 5\' 10"  (1.778 m)   Wt 90.7 kg  SpO2 100%   BMI 28.70 kg/m   Physical Exam  Constitutional: He appears well-developed and well-nourished. No distress.  HENT:  Head: Normocephalic and atraumatic.  Nose: Nose normal.  Mouth/Throat: Uvula is midline, oropharynx is clear and moist and mucous membranes are normal.  Eyes: Pupils are equal, round, and reactive to light.  Neck: Normal range of motion. Neck supple.  Cardiovascular: Normal rate and regular rhythm.   Pulmonary/Chest: Effort normal.  Abdominal: Soft. Bowel sounds are normal. There is no tenderness. There is no rigidity, no rebound and no guarding.  No signs of abdominal distention  Genitourinary: Rectal exam shows guaiac positive stool. Rectal exam shows no  tenderness.  Genitourinary Comments: brb clots noted to gluteal fold, very small passage of BRB leaking from rectum.  Musculoskeletal:  No LE swelling  Neurological: He is alert.  Acting at baseline  Skin: Skin is warm and dry. No rash noted.  Nursing note and vitals reviewed.    ED Treatments / Results  Labs (all labs ordered are listed, but only abnormal results are displayed) Labs Reviewed  COMPREHENSIVE METABOLIC PANEL - Abnormal; Notable for the following:       Result Value   Glucose, Bld 121 (*)    All other components within normal limits  CBC WITH DIFFERENTIAL/PLATELET - Abnormal; Notable for the following:    WBC 12.6 (*)    RBC 4.18 (*)    HCT 38.3 (*)    Neutro Abs 10.3 (*)    All other components within normal limits  CBG MONITORING, ED - Abnormal; Notable for the following:    Glucose-Capillary 106 (*)    All other components within normal limits  PROTIME-INR  ETHANOL  OCCULT BLOOD X 1 CARD TO LAB, STOOL  TYPE AND SCREEN  ABO/RH    EKG  EKG Interpretation None       Radiology No results found.  Procedures Procedures (including critical care time)  Medications Ordered in ED Medications - No data to display   Initial Impression / Assessment and Plan / ED Course  I have reviewed the triage vital signs and the nursing notes.  Pertinent labs & imaging results that were available during my care of the patient were reviewed by me and considered in my medical decision making (see chart for details).  Clinical Course     2:15 pm - Case discussed with Dr. Patria Mane, will obtain all labs and admit patient. 3:41 pm - Pt continues to have bloody bowel movements in the ED. Not actively passing blood from rectum. Spoke with [facility] who have agreed for admission. [facility], inpatient, Tele. They request I ask GI to consult on patient in case he drops his hemoglobin and they need to scope him. I spoke with GI, who have been made aware patient is here and  being admitted for rectal bleeding.   5:47 pm - Dr. Adela Lank and PA has seen the provider, they are going to do bowel prep tonight and colonoscopy tomorrow. He is concerned for diverticular bleed. Call them if he starts to vomit BRB.   Final Clinical Impressions(s) / ED Diagnoses   Final diagnoses:  Rectal bleeding    New Prescriptions New Prescriptions   No medications on file     Marlon Pel, PA-C 03/03/16 1643    Marlon Pel, PA-C 03/03/16 1748    Azalia Bilis, MD 03/06/16 1654

## 2016-03-03 NOTE — ED Notes (Addendum)
WILL TRANSPORT PT TO 4W 1434-1. AAOX4. PT IN NO APPARENT DISTRESS OR PAIN. IVF INFUSING W/O PAIN OR SWELLING.THE OPPORTUNITY TO ASK QUESTIONS WAS PROVIDED.

## 2016-03-03 NOTE — ED Triage Notes (Signed)
PT RECEIVED FROM HOME VIA EMS FOR RECTAL BLEEDING AND A SYNCOPAL EPISODE. PER EMS, THE PT WAS FOUND LYING ON HIS BATHROOM FLOOR IN A "POOL" OF BLOOD. PT WAS WAS PALE AND DIAPHORETIC, AS WELL. PT WAS GIVEN A BOLUS OF NS PTA. PT STS HE REMEMBERS HAVING A BOWEL MOVEMENT AND BLOOD WAS PORING OUT OF HIM. DENIES ANY HX OF SUCH. PT ALSO C/O CHEST PAIN YESTERDAY. AA0X4.

## 2016-03-03 NOTE — ED Notes (Signed)
Bed: WA22 Expected date:  Expected time:  Means of arrival:  Comments: Rectal bleed 

## 2016-03-03 NOTE — Consult Note (Signed)
Referring Provider: EDP, Dr. Patria Mane Primary Care Physician:  Kristian Covey, MD Primary Gastroenterologist:  None, unassigned  Reason for Consultation:  GI bleed  HPI: Aaron Wilkerson is a 46 y.o. male with no significant medical history, who presents to the hospital with chief complaint of rectal bleeding. Approximately 10 AM this morning he felt like he had to move his bowels and noticed significant amount of dark blood in the toilet.  Occurred one or two other times at home before having a syncopal episode and hitting his nose on the toilet.  EMS was called and the patient was incontinent of a large amount of dark maroon colored stools.  Never had any similar symptoms in the past.  Does use BC powders about two times per week.  Used to be a very heavy drinker for several years but cut back to 2 beers per week in September.  Says that he had a normal BM yesterday.  He does note that for the past several days he's been belching a lot and having some nausea with reflux.  Does not usually need to take TUMs or anything but has been using them and baking soda mixed with water for the past few days.  Denies any abdominal pain.  No vomiting.  History reviewed. No pertinent past medical history.  History reviewed. No pertinent surgical history.  Prior to Admission medications   Medication Sig Start Date End Date Taking? Authorizing Provider  Aspirin-Salicylamide-Caffeine (BC FAST PAIN RELIEF) 650-195-33.3 MG PACK Take 1 packet by mouth daily as needed (for pain).   Yes Historical Provider, MD  clonazePAM (KLONOPIN) 0.5 MG tablet Take 0.5 mg by mouth 2 (two) times daily as needed for anxiety.   Yes Historical Provider, MD  Thiamine HCl (VITAMIN B-1 PO) Take 1 tablet by mouth daily.   Yes Historical Provider, MD  FLUoxetine (PROZAC) 20 MG tablet Take 1 tablet (20 mg total) by mouth daily. Patient not taking: Reported on 03/03/2016 06/25/13   Kristian Covey, MD  LORazepam (ATIVAN) 1 MG tablet Take  1 tablet (1 mg total) by mouth 3 (three) times daily as needed for anxiety. Patient not taking: Reported on 03/03/2016 11/04/15   Donnetta Hutching, MD  promethazine (PHENERGAN) 25 MG tablet Take 1 tablet (25 mg total) by mouth every 6 (six) hours as needed for nausea or vomiting. Patient not taking: Reported on 03/03/2016 11/04/15   Donnetta Hutching, MD    Current Facility-Administered Medications  Medication Dose Route Frequency Provider Last Rate Last Dose  . 0.9 %  sodium chloride infusion   Intravenous Once Marlon Pel, PA-C       Current Outpatient Prescriptions  Medication Sig Dispense Refill  . Aspirin-Salicylamide-Caffeine (BC FAST PAIN RELIEF) 650-195-33.3 MG PACK Take 1 packet by mouth daily as needed (for pain).    . clonazePAM (KLONOPIN) 0.5 MG tablet Take 0.5 mg by mouth 2 (two) times daily as needed for anxiety.    . Thiamine HCl (VITAMIN B-1 PO) Take 1 tablet by mouth daily.    Marland Kitchen FLUoxetine (PROZAC) 20 MG tablet Take 1 tablet (20 mg total) by mouth daily. (Patient not taking: Reported on 03/03/2016) 30 tablet 11  . LORazepam (ATIVAN) 1 MG tablet Take 1 tablet (1 mg total) by mouth 3 (three) times daily as needed for anxiety. (Patient not taking: Reported on 03/03/2016) 20 tablet 0  . promethazine (PHENERGAN) 25 MG tablet Take 1 tablet (25 mg total) by mouth every 6 (six) hours as needed for nausea  or vomiting. (Patient not taking: Reported on 03/03/2016) 15 tablet 0    Allergies as of 03/03/2016  . (No Known Allergies)    Family History  Problem Relation Age of Onset  . Hypertension Mother   . Hypertension Father   . Hyperlipidemia Father   . Heart disease Maternal Grandmother   . Heart disease Maternal Grandfather     Social History   Social History  . Marital status: Single    Spouse name: N/A  . Number of children: N/A  . Years of education: N/A   Occupational History  . Not on file.   Social History Main Topics  . Smoking status: Never Smoker  . Smokeless tobacco:  Current User    Types: Chew  . Alcohol use 100.8 oz/week    168 Cans of beer per week  . Drug use: No  . Sexual activity: Not on file   Other Topics Concern  . Not on file   Social History Narrative  . No narrative on file    Review of Systems: Ten point ROS is O/W negative except as mentioned in HPI.  Physical Exam: Vital signs in last 24 hours: Temp:  [98.4 F (36.9 C)] 98.4 F (36.9 C) (01/17 1357) Pulse Rate:  [91-95] 91 (01/17 1501) Resp:  [10-15] 10 (01/17 1501) BP: (109-133)/(69-84) 109/69 (01/17 1501) SpO2:  [100 %] 100 % (01/17 1501) Weight:  [200 lb (90.7 kg)] 200 lb (90.7 kg) (01/17 1358)   General:  Alert, Well-developed, well-nourished, pleasant and cooperative in NAD; somewhat anxious Head:  Normocephalic and atraumatic. Eyes:  Sclera clear, no icterus.  Conjunctiva pink. Ears:  Normal auditory acuity. Mouth:  No deformity or lesions.   Lungs:  Clear throughout to auscultation.  No wheezes, crackles, or rhonchi.  Heart:  Regular rate and rhythm; no murmurs, clicks, rubs, or gallops. Abdomen:  Soft, non-distended.  BS present, hyperactive.  Non-tender. Rectal:  Deferred.  Small amount of stool in BSC with dark maroon clot. Msk:  Symmetrical without gross deformities. Pulses:  Normal pulses noted. Extremities:  Without clubbing or edema. Neurologic:  Alert and oriented x 4;  grossly normal neurologically. Skin:  Intact without significant lesions or rashes. Psych:  Alert and cooperative. Normal mood and affect.  Lab Results:  Recent Labs  03/03/16 1416  WBC 12.6*  HGB 13.1  HCT 38.3*  PLT 158   BMET  Recent Labs  03/03/16 1416  NA 140  K 3.8  CL 107  CO2 25  GLUCOSE 121*  BUN 20  CREATININE 0.74  CALCIUM 9.2   LFT  Recent Labs  03/03/16 1416  PROT 7.1  ALBUMIN 4.2  AST 32  ALT 26  ALKPHOS 101  BILITOT 0.8   PT/INR  Recent Labs  03/03/16 1416  LABPROT 15.0  INR 1.17   IMPRESSION:  -46 year old male with GI bleed:  ?  Upper vs lower.  Has history of NSAID use and ETOH abuse although no known liver disease.  ? Diverticular, etc.  PLAN: -I have asked the ED nurse to place NGT and perform gastric lavage to confirm this is not an upper GI bleed.  If it is upper then may need EGD tonight with PPI gtt, etc. -If NG lavage is negative then will plan for colonoscopy prep this evening and colonoscopy tomorrow.  But if he were to re-bleed briskly then will need CT angio with possible IR intervention/angio. -Monitor Hgb and transfuse prn.  Zaiyah Sottile D.  03/03/2016,  4:16 PM  Pager number [phone]

## 2016-03-04 ENCOUNTER — Encounter (HOSPITAL_COMMUNITY): Payer: Self-pay | Admitting: Anesthesiology

## 2016-03-04 ENCOUNTER — Inpatient Hospital Stay (HOSPITAL_COMMUNITY): Payer: BLUE CROSS/BLUE SHIELD | Admitting: Anesthesiology

## 2016-03-04 ENCOUNTER — Encounter (HOSPITAL_COMMUNITY)
Admission: EM | Disposition: A | Discharge status: Home / Self Care | Payer: Self-pay | Source: Home / Self Care | Attending: Internal Medicine

## 2016-03-04 DIAGNOSIS — R55 Syncope and collapse: Secondary | ICD-10-CM

## 2016-03-04 DIAGNOSIS — K5731 Diverticulosis of large intestine without perforation or abscess with bleeding: Principal | ICD-10-CM

## 2016-03-04 DIAGNOSIS — K3189 Other diseases of stomach and duodenum: Secondary | ICD-10-CM

## 2016-03-04 DIAGNOSIS — D62 Acute posthemorrhagic anemia: Secondary | ICD-10-CM

## 2016-03-04 DIAGNOSIS — F411 Generalized anxiety disorder: Secondary | ICD-10-CM

## 2016-03-04 HISTORY — PX: COLONOSCOPY: SHX5424

## 2016-03-04 HISTORY — PX: ESOPHAGOGASTRODUODENOSCOPY: SHX5428

## 2016-03-04 LAB — HEMOGLOBIN AND HEMATOCRIT, BLOOD
HCT: 29.4 % — ABNORMAL LOW (ref 39.0–52.0)
HCT: 29.7 % — ABNORMAL LOW (ref 39.0–52.0)
Hemoglobin: 10.2 g/dL — ABNORMAL LOW (ref 13.0–17.0)
Hemoglobin: 10.2 g/dL — ABNORMAL LOW (ref 13.0–17.0)

## 2016-03-04 LAB — CBC
HCT: 31.3 % — ABNORMAL LOW (ref 39.0–52.0)
Hemoglobin: 10.8 g/dL — ABNORMAL LOW (ref 13.0–17.0)
MCH: 31.6 pg (ref 26.0–34.0)
MCHC: 34.5 g/dL (ref 30.0–36.0)
MCV: 91.5 fL (ref 78.0–100.0)
Platelets: 128 10*3/uL — ABNORMAL LOW (ref 150–400)
RBC: 3.42 MIL/uL — ABNORMAL LOW (ref 4.22–5.81)
RDW: 12.6 % (ref 11.5–15.5)
WBC: 8.2 10*3/uL (ref 4.0–10.5)

## 2016-03-04 LAB — COMPREHENSIVE METABOLIC PANEL
ALT: 22 U/L (ref 17–63)
AST: 24 U/L (ref 15–41)
Albumin: 3.6 g/dL (ref 3.5–5.0)
Alkaline Phosphatase: 73 U/L (ref 38–126)
Anion gap: 6 (ref 5–15)
BUN: 19 mg/dL (ref 6–20)
CO2: 24 mmol/L (ref 22–32)
Calcium: 8.5 mg/dL — ABNORMAL LOW (ref 8.9–10.3)
Chloride: 109 mmol/L (ref 101–111)
Creatinine, Ser: 0.7 mg/dL (ref 0.61–1.24)
GFR calc Af Amer: >60 mL/min (ref >60)
GFR calc non Af Amer: >60 mL/min (ref >60)
Glucose, Bld: 138 mg/dL — ABNORMAL HIGH (ref 65–99)
Potassium: 4.2 mmol/L (ref 3.5–5.1)
Sodium: 139 mmol/L (ref 135–145)
Total Bilirubin: 0.9 mg/dL (ref 0.3–1.2)
Total Protein: 6.1 g/dL — ABNORMAL LOW (ref 6.5–8.1)

## 2016-03-04 SURGERY — COLONOSCOPY
Anesthesia: Monitor Anesthesia Care

## 2016-03-04 MED ORDER — ONDANSETRON HCL 4 MG/2ML IJ SOLN
INTRAMUSCULAR | Status: AC
Start: 2016-03-04 — End: 2016-03-04
  Filled 2016-03-04: qty 2

## 2016-03-04 MED ORDER — PROPOFOL 10 MG/ML IV BOLUS
INTRAVENOUS | Status: AC
  Filled 2016-03-04: qty 20

## 2016-03-04 MED ORDER — SPOT INK MARKER SYRINGE KIT
PACK | SUBMUCOSAL | Status: DC | PRN
  Administered 2016-03-04: 3.5 mL via SUBMUCOSAL

## 2016-03-04 MED ORDER — PROPOFOL 500 MG/50ML IV EMUL
INTRAVENOUS | Status: DC | PRN
  Administered 2016-03-04: 125 ug/kg/min via INTRAVENOUS

## 2016-03-04 MED ORDER — MIDAZOLAM HCL 2 MG/2ML IJ SOLN
INTRAMUSCULAR | Status: AC
  Filled 2016-03-04: qty 2

## 2016-03-04 MED ORDER — BUTAMBEN-TETRACAINE-BENZOCAINE 2-2-14 % EX AERO
INHALATION_SPRAY | CUTANEOUS | Status: DC | PRN
  Administered 2016-03-04: 2 via TOPICAL

## 2016-03-04 MED ORDER — MIDAZOLAM HCL 5 MG/5ML IJ SOLN
INTRAMUSCULAR | Status: DC | PRN
  Administered 2016-03-04: 2 mg via INTRAVENOUS

## 2016-03-04 MED ORDER — PROPOFOL 10 MG/ML IV BOLUS
INTRAVENOUS | Status: DC | PRN
  Administered 2016-03-04: 50 mg via INTRAVENOUS

## 2016-03-04 MED ORDER — ONDANSETRON HCL 4 MG/2ML IJ SOLN
INTRAMUSCULAR | Status: AC
  Filled 2016-03-04: qty 2

## 2016-03-04 MED ORDER — LACTATED RINGERS IV SOLN
INTRAVENOUS | Status: DC | PRN
  Administered 2016-03-04 (×2): via INTRAVENOUS

## 2016-03-04 MED ORDER — LACTATED RINGERS IV SOLN
Freq: Once | INTRAVENOUS | Status: AC
  Administered 2016-03-04: 09:00:00 via INTRAVENOUS

## 2016-03-04 NOTE — Op Note (Signed)
[facility] Patient Name: Aaron Wilkerson Procedure Date: 03/04/2016 MRN: [id number] Attending MD: Carlota Raspberry. Armbruster MD, MD Date of Birth: 1970/03/24 CSN: [id number] Age: 46 Admit Type: Inpatient Procedure:                Colonoscopy Indications:              Rectal bleeding (maroon colored stools), EGD                            negative Providers:                Remo Lipps P. Armbruster MD, MD, Kingsley Plan, RN,                            Corliss Parish, Technician Referring MD:              Medicines:                Monitored Anesthesia Care Complications:            No immediate complications. Estimated blood loss:                            Minimal. Estimated Blood Loss:     Estimated blood loss was minimal. Procedure:                Pre-Anesthesia Assessment:                           - Prior to the procedure, a History and Physical                            was performed, and patient medications and                            allergies were reviewed. The patient's tolerance of                            previous anesthesia was also reviewed. The risks                            and benefits of the procedure and the sedation                            options and risks were discussed with the patient.                            All questions were answered, and informed consent                            was obtained. Prior Anticoagulants: The patient has                            taken no previous anticoagulant or antiplatelet                            agents. ASA Grade Assessment: III -  A patient with                            severe systemic disease. After reviewing the risks                            and benefits, the patient was deemed in                            satisfactory condition to undergo the procedure.                           After obtaining informed consent, the colonoscope                            was passed under direct vision.  Throughout the                            procedure, the patient's blood pressure, pulse, and                            oxygen saturations were monitored continuously. The                            EC-[id number] ([id number]) scope was introduced through                            the anus and advanced to the the terminal ileum,                            with identification of the appendiceal orifice and                            IC valve. The colonoscopy was performed without                            difficulty. The patient tolerated the procedure                            well. The quality of the bowel preparation was                            fair. The terminal ileum, ileocecal valve,                            appendiceal orifice, and rectum were photographed. Scope In: 9:38:36 AM Scope Out: 10:18:23 AM Scope Withdrawal Time: 0 hours 27 minutes 46 seconds  Total Procedure Duration: 0 hours 39 minutes 47 seconds  Findings:      The perianal and digital rectal examinations were normal.      Maroon colored blood was found in the entire colon. The prep was only       fair and significant time was spent lavaging the colon to obtain       adequate views.      A  single medium-mouthed diverticulum was found in the distal ascending       colon / hepatic flexure. When pleated back with forceps the base of it       was ulcerated and very friable. For hemostasis, two hemostatic clips       were successfully placed. Area adjacent to the diverticulum was tattooed       with an injection of Spot (carbon black).      The terminal ileum appeared normal.      Non-bleeding internal hemorrhoids were found during retroflexion. The       hemorrhoids were moderate.      The exam was otherwise without abnormality following lavage. No other       obvious diverticulum were noted or pathology to cause the patient's       symptoms. No active bleeding at the end of the procedure. Impression:               -  Preparation of the colon was fair.                           - Blood in the entire examined colon.                           - Diverticulosis in the distal ascending colon,                            suspect this may have been the site of bleeding.                            Clips were placed. Tattooed.                           - The examined portion of the ileum was normal.                           - Non-bleeding internal hemorrhoids.                           - The examination was otherwise normal. Bowel prep                            was inadequate for screening purposes. Moderate Sedation:      No moderate sedation, case performed with MAC Recommendation:           - Return patient to hospital ward for ongoing care.                           - Clear liquid diet okay now.                           - Continue present medications.                           - Serial CBC                           - Monitor for recurrence of bleeding. If bleeding  recurs consider CT angio or tagged RBC scan to                            further evaluate.                           - Repeat colonoscopy for screening purposes at age                            44 Procedure Code(s):        --- Professional ---                           [phone], Colonoscopy, flexible; with control of                            bleeding, any method Diagnosis Code(s):        --- Professional ---                           K64.8, Other hemorrhoids                           K92.2, Gastrointestinal hemorrhage, unspecified                           K62.5, Hemorrhage of anus and rectum                           K57.30, Diverticulosis of large intestine without                            perforation or abscess without bleeding CPT copyright 2016 American [employer] Association. All rights reserved. The codes documented in this report are preliminary and upon coder review may  be revised to meet current  compliance requirements. Remo Lipps P. Armbruster MD, MD 03/04/2016 10:29:08 AM This report has been signed electronically. Number of Addenda: 0

## 2016-03-04 NOTE — Progress Notes (Signed)
PROGRESS NOTE    Aaron Wilkerson  WUJ:[id number] DOB: [birth year] DOA: 03/03/2016 PCP: Kristian Covey, MD   Brief Narrative:  Aaron Wilkerson is a 46 y.o. male with no significant medical history, who presents to the hospital chief complaint of rectal bleeding. Approximately 10 AM yesterday morning he noticed significant bright red blood per rectum, it has been persistent, with no improving or worsening factors, it has been associated with nausea, mild lower abdominal pain, dizziness and orthostatic symptoms. Patient suffered syncope while going from his bedroom to the bathroom, experiencing head trauma. So far patient had at least 3 large bloody bowel movements. Due to his persistent symptoms he came to the hospital for further evaluation. Was worked up and admitted for Lower GI bleed and was evaluated by Gastroenterology and underwent Upper Endoscopy and Colonoscopy today.  Assessment & Plan:   Principal Problem:   GI bleeding Active Problems:   Rectal bleeding   Acute blood loss anemia   Erosive gastropathy   Syncope  Acute Lower GI bleed Likely 2/2 to Diverticulosis -Patient's Hb/Hct went from 13.1/38.3 -> 10.2/29.4  -Appreciate Gastroenterology Evaluation and recc's -FOBT Positive -Patent is s/p Colonoscopy: Showed Blood in the entire examined colon and diverticulosis in the distal ascending colon was noted and was suspected to be the site of bleeding. Clips were place and it was tattooed with Carbon Black. The examined portion of the ileum was normal and patient was found to have non-bleeding internal hemorrhiods. Otherwise examination was normal and bowel prep was inadequate for screening purposes and repeat colonoscopy was recommended at age 16 -Patient is also s/p Endoscopy to R/o Upper GI Bleed: Showed Esophagogastric landmarks identified, with normal esophagus, erosive gastropathy, and Erythematous duodenopathy and suspected changes in the stomach and duodenum due to NSAID use.  Will r/o H. Pylori. -GI advised to place on Clear Liquid Diet and monitor serial H/H's and also advised to monitor for reccurence of bleeding and if it does re-occur consider CT-Angio or Tagged RBC cell scan to further evaluate.  -Continue to check H&H every 6 hours, -C/w Supportive Care with IVF of D5W NS at 75 mL/hr; Clear Liquid Diet -C/w Pantoprazole 40 mg IV q12h  Acute Blood Loss Anemia -Patient's Hb/Hct went from 13.1/38.3 -> 10.2/29.4 -As Above  Erosive Gastropathy and Erythematous Duodenopathy -EGD Findings as above -C/w PPI 40 mg IV q12h -H Pylori Serology ordered by Gastroenterology  Syncope  -Likely from Acute Blood Loss/Orthostasis/Vasovagal  -IVF Rehydration and obtain orthostatic VS in Am -Continue to Monitor  Anxiety -Continue Clonazepam 0.5 mg po BIDprn,   DVT prophylaxis: SCDs Code Status: FULL CODE Family Communication: No Family present at bedside Disposition Plan: Home Likely when stable for D/C  Consultants:   Gastroenterology   Procedures: Upper Endoscopy and Colonoscopy   Antimicrobials: None  Subjective: Seen and examined after EGD and Colonoscopy and states he was feeling ok. No N/V. Never had bloody stools before. Admitted to using intermittent NSAID use. No Abdominal Pain or Cp. No other concerns or complaints.   Objective: Vitals:   03/04/16 1040 03/04/16 1050 03/04/16 1111 03/04/16 1500  BP: (!) 110/57 (!) 115/53 110/64 116/81  Pulse: 72 62 61 63  Resp: 15 11  16   Temp:    99.1 F (37.3 C)  TempSrc:    Oral  SpO2: 100% 100% 100% 100%  Weight:      Height:        Intake/Output Summary (Last 24 hours) at 03/04/16 1837 Last data filed at  03/04/16 1400  Gross per 24 hour  Intake           2402.5 ml  Output              650 ml  Net           1752.5 ml   Filed Weights   03/03/16 1358 03/03/16 2052 03/04/16 0848  Weight: 90.7 kg (200 lb) 89.4 kg (197 lb 1.5 oz) 89.4 kg (197 lb)   Examination: Physical Exam:  Constitutional:  WN/WD, NAD and appears calm and comfortable Eyes: Lids and conjunctivae normal, sclerae anicteric  ENMT: External Ears, Nose appear normal. Grossly normal hearing. Neck: Appears normal, supple, no cervical masses, normal ROM, no appreciable thyromegaly, no JVD Respiratory: Clear to auscultation bilaterally, no wheezing, rales, rhonchi or crackles. Normal respiratory effort and patient is not tachypenic. No accessory muscle use.  Cardiovascular: RRR, no murmurs / rubs / gallops. S1 and S2 auscultated. No extremity edema.  Abdomen: Soft, non-tender, mildly disenteded. No masses palpated. No appreciable hepatosplenomegaly. Bowel sounds positive x4.  GU: Deferred. Musculoskeletal: No clubbing / cyanosis of digits/nails. No joint deformity upper and lower extremities.  Skin: No rashes, lesions, ulcers. No induration; Warm and dry.  Neurologic: CN 2-12 grossly intact with no focal deficits. Strength 5/5 in all 4. Romberg sign cerebellar reflexes not assessed.  Psychiatric: Normal judgment and insight. Alert and oriented x 3. Normal mood and appropriate affect.   Data Reviewed: I have personally reviewed following labs and imaging studies  CBC:  Recent Labs Lab 03/03/16 1416 03/03/16 2053 03/04/16 0244 03/04/16 1123 03/04/16 1433  WBC 12.6*  --  8.2  --   --   NEUTROABS 10.3*  --   --   --   --   HGB 13.1 11.3* 10.8* 10.2* 10.2*  HCT 38.3* 33.0* 31.3* 29.4* 29.7*  MCV 91.6  --  91.5  --   --   PLT 158  --  128*  --   --    Basic Metabolic Panel:  Recent Labs Lab 03/03/16 1416 03/04/16 0244  NA 140 139  K 3.8 4.2  CL 107 109  CO2 25 24  GLUCOSE 121* 138*  BUN 20 19  CREATININE 0.74 0.70  CALCIUM 9.2 8.5*   GFR: Estimated Creatinine Clearance: 129.9 mL/min (by C-G formula based on SCr of 0.7 mg/dL). Liver Function Tests:  Recent Labs Lab 03/03/16 1416 03/04/16 0244  AST 32 24  ALT 26 22  ALKPHOS 101 73  BILITOT 0.8 0.9  PROT 7.1 6.1*  ALBUMIN 4.2 3.6   No results  for input(s): LIPASE, AMYLASE in the last 168 hours. No results for input(s): AMMONIA in the last 168 hours. Coagulation Profile:  Recent Labs Lab 03/03/16 1416  INR 1.17   Cardiac Enzymes: No results for input(s): CKTOTAL, CKMB, CKMBINDEX, TROPONINI in the last 168 hours. BNP (last 3 results) No results for input(s): PROBNP in the last 8760 hours. HbA1C: No results for input(s): HGBA1C in the last 72 hours. CBG:  Recent Labs Lab 03/03/16 1426  GLUCAP 106*   Lipid Profile: No results for input(s): CHOL, HDL, LDLCALC, TRIG, CHOLHDL, LDLDIRECT in the last 72 hours. Thyroid Function Tests: No results for input(s): TSH, T4TOTAL, FREET4, T3FREE, THYROIDAB in the last 72 hours. Anemia Panel: No results for input(s): VITAMINB12, FOLATE, FERRITIN, TIBC, IRON, RETICCTPCT in the last 72 hours. Sepsis Labs: No results for input(s): PROCALCITON, LATICACIDVEN in the last 168 hours.  No results found for this or  any previous visit (from the past 240 hour(s)).   Radiology Studies: No results found.   Upper GI Endoscopy                           - Esophagogastric landmarks identified.                           - Normal esophagus otherwise                           - Erosive gastropathy.                           - Erythematous duodenopathy.                           Suspect changes in the stomach and duodenum due to                            NSAIDs, but would rule out H pylori.  Colonoscopy       - Preparation of the colon was fair.                           - Blood in the entire examined colon.                           - Diverticulosis in the distal ascending colon,                            suspect this may have been the site of bleeding.                            Clips were placed. Tattooed.                           - The examined portion of the ileum was normal.                           - Non-bleeding internal hemorrhoids.                           - The examination was  otherwise normal. Bowel prep                            was inadequate for screening purposes.   Scheduled Meds: . pantoprazole  40 mg Intravenous Q12H  . sodium chloride flush  3 mL Intravenous Q12H  . thiamine  100 mg Oral Daily   Continuous Infusions: . dextrose 5 % and 0.9% NaCl 75 mL/hr at 03/04/16 1524    LOS: 1 day   Merlene Laughter, DO [facility] Pager [phone]  If 7PM-7AM, please contact night-coverage [email or web address] Password [facility] 03/04/2016, 6:37 PM

## 2016-03-04 NOTE — Anesthesia Postprocedure Evaluation (Signed)
Anesthesia Post Note  Patient: Aaron Wilkerson  Procedure(s) Performed: Procedure(s) (LRB): COLONOSCOPY (N/A) ESOPHAGOGASTRODUODENOSCOPY (EGD) (N/A)  Patient location during evaluation: PACU Anesthesia Type: MAC Level of consciousness: awake and alert Pain management: pain level controlled Vital Signs Assessment: post-procedure vital signs reviewed and stable Respiratory status: spontaneous breathing, nonlabored ventilation, respiratory function stable and patient connected to nasal cannula oxygen Cardiovascular status: stable and blood pressure returned to baseline Anesthetic complications: no       Last Vitals:  Vitals:   03/04/16 0848 03/04/16 1040  BP: 132/84 (!) 110/57  Pulse: 65 72  Resp: 11 15  Temp: 36.8 C     Last Pain:  Vitals:   03/04/16 0848  TempSrc: Oral                 [name] S

## 2016-03-04 NOTE — Transfer of Care (Signed)
Immediate Anesthesia Transfer of Care Note  Patient: Aaron Wilkerson  Procedure(s) Performed: Procedure(s): COLONOSCOPY (N/A) ESOPHAGOGASTRODUODENOSCOPY (EGD) (N/A)  Patient Location: PACU  Anesthesia Type:MAC  Level of Consciousness:  sedated, patient cooperative and responds to stimulation  Airway & Oxygen Therapy:Patient Spontanous Breathing and Patient connected to face mask oxgen  Post-op Assessment:  Report given to PACU RN and Post -op Vital signs reviewed and stable  Post vital signs:  Reviewed and stable  Last Vitals:  Vitals:   03/04/16 0700 03/04/16 0848  BP: 118/78 132/84  Pulse: 65 65  Resp: 17 11  Temp: 37.3 C 32.5 C    Complications: No apparent anesthesia complications

## 2016-03-04 NOTE — H&P (View-Only) (Signed)
Referring Provider: EDP, Dr. Patria Mane Primary Care Physician:  Kristian Covey, MD Primary Gastroenterologist:  None, unassigned  Reason for Consultation:  GI bleed  HPI: Aaron Wilkerson is a 46 y.o. male with no significant medical history, who presents to the hospital with chief complaint of rectal bleeding. Approximately 10 AM this morning he felt like he had to move his bowels and noticed significant amount of dark blood in the toilet.  Occurred one or two other times at home before having a syncopal episode and hitting his nose on the toilet.  EMS was called and the patient was incontinent of a large amount of dark maroon colored stools.  Never had any similar symptoms in the past.  Does use BC powders about two times per week.  Used to be a very heavy drinker for several years but cut back to 2 beers per week in September.  Says that he had a normal BM yesterday.  He does note that for the past several days he's been belching a lot and having some nausea with reflux.  Does not usually need to take TUMs or anything but has been using them and baking soda mixed with water for the past few days.  Denies any abdominal pain.  No vomiting.  History reviewed. No pertinent past medical history.  History reviewed. No pertinent surgical history.  Prior to Admission medications   Medication Sig Start Date End Date Taking? Authorizing Provider  Aspirin-Salicylamide-Caffeine (BC FAST PAIN RELIEF) 650-195-33.3 MG PACK Take 1 packet by mouth daily as needed (for pain).   Yes Historical Provider, MD  clonazePAM (KLONOPIN) 0.5 MG tablet Take 0.5 mg by mouth 2 (two) times daily as needed for anxiety.   Yes Historical Provider, MD  Thiamine HCl (VITAMIN B-1 PO) Take 1 tablet by mouth daily.   Yes Historical Provider, MD  FLUoxetine (PROZAC) 20 MG tablet Take 1 tablet (20 mg total) by mouth daily. Patient not taking: Reported on 03/03/2016 06/25/13   Kristian Covey, MD  LORazepam (ATIVAN) 1 MG tablet Take  1 tablet (1 mg total) by mouth 3 (three) times daily as needed for anxiety. Patient not taking: Reported on 03/03/2016 11/04/15   Donnetta Hutching, MD  promethazine (PHENERGAN) 25 MG tablet Take 1 tablet (25 mg total) by mouth every 6 (six) hours as needed for nausea or vomiting. Patient not taking: Reported on 03/03/2016 11/04/15   Donnetta Hutching, MD    Current Facility-Administered Medications  Medication Dose Route Frequency Provider Last Rate Last Dose  . 0.9 %  sodium chloride infusion   Intravenous Once Marlon Pel, PA-C       Current Outpatient Prescriptions  Medication Sig Dispense Refill  . Aspirin-Salicylamide-Caffeine (BC FAST PAIN RELIEF) 650-195-33.3 MG PACK Take 1 packet by mouth daily as needed (for pain).    . clonazePAM (KLONOPIN) 0.5 MG tablet Take 0.5 mg by mouth 2 (two) times daily as needed for anxiety.    . Thiamine HCl (VITAMIN B-1 PO) Take 1 tablet by mouth daily.    Marland Kitchen FLUoxetine (PROZAC) 20 MG tablet Take 1 tablet (20 mg total) by mouth daily. (Patient not taking: Reported on 03/03/2016) 30 tablet 11  . LORazepam (ATIVAN) 1 MG tablet Take 1 tablet (1 mg total) by mouth 3 (three) times daily as needed for anxiety. (Patient not taking: Reported on 03/03/2016) 20 tablet 0  . promethazine (PHENERGAN) 25 MG tablet Take 1 tablet (25 mg total) by mouth every 6 (six) hours as needed for nausea  or vomiting. (Patient not taking: Reported on 03/03/2016) 15 tablet 0    Allergies as of 03/03/2016  . (No Known Allergies)    Family History  Problem Relation Age of Onset  . Hypertension Mother   . Hypertension Father   . Hyperlipidemia Father   . Heart disease Maternal Grandmother   . Heart disease Maternal Grandfather     Social History   Social History  . Marital status: Single    Spouse name: N/A  . Number of children: N/A  . Years of education: N/A   Occupational History  . Not on file.   Social History Main Topics  . Smoking status: Never Smoker  . Smokeless tobacco:  Current User    Types: Chew  . Alcohol use 100.8 oz/week    168 Cans of beer per week  . Drug use: No  . Sexual activity: Not on file   Other Topics Concern  . Not on file   Social History Narrative  . No narrative on file    Review of Systems: Ten point ROS is O/W negative except as mentioned in HPI.  Physical Exam: Vital signs in last 24 hours: Temp:  [98.4 F (36.9 C)] 98.4 F (36.9 C) (01/17 1357) Pulse Rate:  [91-95] 91 (01/17 1501) Resp:  [10-15] 10 (01/17 1501) BP: (109-133)/(69-84) 109/69 (01/17 1501) SpO2:  [100 %] 100 % (01/17 1501) Weight:  [200 lb (90.7 kg)] 200 lb (90.7 kg) (01/17 1358)   General:  Alert, Well-developed, well-nourished, pleasant and cooperative in NAD; somewhat anxious Head:  Normocephalic and atraumatic. Eyes:  Sclera clear, no icterus.  Conjunctiva pink. Ears:  Normal auditory acuity. Mouth:  No deformity or lesions.   Lungs:  Clear throughout to auscultation.  No wheezes, crackles, or rhonchi.  Heart:  Regular rate and rhythm; no murmurs, clicks, rubs, or gallops. Abdomen:  Soft, non-distended.  BS present, hyperactive.  Non-tender. Rectal:  Deferred.  Small amount of stool in BSC with dark maroon clot. Msk:  Symmetrical without gross deformities. Pulses:  Normal pulses noted. Extremities:  Without clubbing or edema. Neurologic:  Alert and oriented x 4;  grossly normal neurologically. Skin:  Intact without significant lesions or rashes. Psych:  Alert and cooperative. Normal mood and affect.  Lab Results:  Recent Labs  03/03/16 1416  WBC 12.6*  HGB 13.1  HCT 38.3*  PLT 158   BMET  Recent Labs  03/03/16 1416  NA 140  K 3.8  CL 107  CO2 25  GLUCOSE 121*  BUN 20  CREATININE 0.74  CALCIUM 9.2   LFT  Recent Labs  03/03/16 1416  PROT 7.1  ALBUMIN 4.2  AST 32  ALT 26  ALKPHOS 101  BILITOT 0.8   PT/INR  Recent Labs  03/03/16 1416  LABPROT 15.0  INR 1.17   IMPRESSION:  -46 year old male with GI bleed:  ?  Upper vs lower.  Has history of NSAID use and ETOH abuse although no known liver disease.  ? Diverticular, etc.  PLAN: -I have asked the ED nurse to place NGT and perform gastric lavage to confirm this is not an upper GI bleed.  If it is upper then may need EGD tonight with PPI gtt, etc. -If NG lavage is negative then will plan for colonoscopy prep this evening and colonoscopy tomorrow.  But if he were to re-bleed briskly then will need CT angio with possible IR intervention/angio. -Monitor Hgb and transfuse prn.  Jean Skow D.  03/03/2016,  4:16 PM  Pager number [phone]

## 2016-03-04 NOTE — Anesthesia Preprocedure Evaluation (Signed)
Anesthesia Evaluation  Patient identified by MRN, date of birth, ID band Patient awake    Reviewed: Allergy & Precautions, NPO status , Patient's Chart, lab work & pertinent test results  Airway Mallampati: II  TM Distance: >3 FB Neck ROM: Full    Dental no notable dental hx.    Pulmonary neg pulmonary ROS,    Pulmonary exam normal breath sounds clear to auscultation       Cardiovascular negative cardio ROS Normal cardiovascular exam Rhythm:Regular Rate:Normal     Neuro/Psych Anxiety Depression negative neurological ROS     GI/Hepatic negative GI ROS, Neg liver ROS,   Endo/Other  negative endocrine ROS  Renal/GU negative Renal ROS  negative genitourinary   Musculoskeletal negative musculoskeletal ROS (+)   Abdominal   Peds negative pediatric ROS (+)  Hematology negative hematology ROS (+)   Anesthesia Other Findings   Reproductive/Obstetrics negative OB ROS                             Anesthesia Physical Anesthesia Plan  ASA: II  Anesthesia Plan: MAC   Post-op Pain Management:    Induction: Intravenous  Airway Management Planned:   Additional Equipment:   Intra-op Plan:   Post-operative Plan:   Informed Consent: I have reviewed the patients History and Physical, chart, labs and discussed the procedure including the risks, benefits and alternatives for the proposed anesthesia with the patient or authorized representative who has indicated his/her understanding and acceptance.   Dental advisory given  Plan Discussed with: CRNA and Surgeon  Anesthesia Plan Comments:         Anesthesia Quick Evaluation

## 2016-03-04 NOTE — Op Note (Signed)
[facility] Patient Name: Aaron Wilkerson Procedure Date: 03/04/2016 MRN: [id number] Attending MD: Carlota Raspberry. Armbruster MD, MD Date of Birth: Jan 09, 1971 CSN: [id number] Age: 46 Admit Type: Inpatient Procedure:                Upper GI endoscopy Indications:              Hematochezia (maroon stools), rule out upper GI                            bleed Providers:                Carlota Raspberry. Armbruster MD, MD, Kingsley Plan, RN,                            Corliss Parish, Technician Referring MD:              Medicines:                Monitored Anesthesia Care Complications:            No immediate complications. Estimated blood loss:                            None. Estimated Blood Loss:     Estimated blood loss: none. Procedure:                Pre-Anesthesia Assessment:                           - Prior to the procedure, a History and Physical                            was performed, and patient medications and                            allergies were reviewed. The patient's tolerance of                            previous anesthesia was also reviewed. The risks                            and benefits of the procedure and the sedation                            options and risks were discussed with the patient.                            All questions were answered, and informed consent                            was obtained. Prior Anticoagulants: The patient has                            taken no previous anticoagulant or antiplatelet                            agents. ASA Grade Assessment:  III - A patient with                            severe systemic disease. After reviewing the risks                            and benefits, the patient was deemed in                            satisfactory condition to undergo the procedure.                           After obtaining informed consent, the endoscope was                            passed under direct vision. Throughout  the                            procedure, the patient's blood pressure, pulse, and                            oxygen saturations were monitored continuously. The                            EG-[id number] ([id number]) scope was introduced through the                            mouth, and advanced to the second part of duodenum.                            The upper GI endoscopy was accomplished without                            difficulty. The patient tolerated the procedure                            well. Scope In: Scope Out: Findings:      Esophagogastric landmarks were identified: the Z-line was found at 40       cm, the gastroesophageal junction was found at 40 cm and the upper       extent of the gastric folds was found at 40 cm from the incisors.      The exam of the esophagus was otherwise normal.      A single small erosion was found at the pylorus without ulceration.      The exam of the stomach was otherwise normal.      Patchy erythematous mucosa was found in the duodenal bulb.      The exam of the duodenum was otherwise normal. Impression:               - Esophagogastric landmarks identified.                           - Normal esophagus otherwise                           -  Erosive gastropathy.                           - Erythematous duodenopathy.                           Suspect changes in the stomach and duodenum due to                            NSAIDs, but would rule out H pylori. Moderate Sedation:      No moderate sedation, case performed with MAC Recommendation:           - Return patient to hospital ward for ongoing care.                           - Clear liquid diet.                           - Continue present medications.                           - H pylori IgG serology                           - Recommendations per colonoscopy note Procedure Code(s):        --- Professional ---                           [phone], Esophagogastroduodenoscopy, flexible,                             transoral; diagnostic, including collection of                            specimen(s) by brushing or washing, when performed                            (separate procedure) Diagnosis Code(s):        --- Professional ---                           K31.89, Other diseases of stomach and duodenum                           K92.1, Melena (includes Hematochezia) CPT copyright 2016 American [employer] Association. All rights reserved. The codes documented in this report are preliminary and upon coder review may  be revised to meet current compliance requirements. Remo Lipps P. Armbruster MD, MD 03/04/2016 10:39:24 AM This report has been signed electronically. Number of Addenda: 0

## 2016-03-04 NOTE — Plan of Care (Signed)
Problem: Education: Goal: Knowledge of [facility] Education information/materials will improve Outcome: Completed/Met Date Met: 03/04/16 Pl;an reviewed with pt/spouse. Pt verbalized understanding of poc

## 2016-03-04 NOTE — Interval H&P Note (Signed)
History and Physical Interval Note:  03/04/2016 9:19 AM  Aaron Wilkerson  has presented today for surgery, with the diagnosis of GI bleed  The various methods of treatment have been discussed with the patient and family. After consideration of risks, benefits and other options for treatment, the patient has consented to  Procedure(s): COLONOSCOPY (N/A) ESOPHAGOGASTRODUODENOSCOPY (EGD) (N/A) as a surgical intervention .  The patient's history has been reviewed, patient examined, no change in status, stable for surgery.  I have reviewed the patient's chart and labs.  Questions were answered to the patient's satisfaction.     Reeves ForthSteven Paul Armbruster

## 2016-03-05 ENCOUNTER — Encounter (HOSPITAL_COMMUNITY): Payer: Self-pay

## 2016-03-05 DIAGNOSIS — K5731 Diverticulosis of large intestine without perforation or abscess with bleeding: Secondary | ICD-10-CM

## 2016-03-05 DIAGNOSIS — D62 Acute posthemorrhagic anemia: Secondary | ICD-10-CM

## 2016-03-05 LAB — CBC WITH DIFFERENTIAL/PLATELET
Basophils Absolute: 0 10*3/uL (ref 0.0–0.1)
Basophils Relative: 1 %
Eosinophils Absolute: 0.1 10*3/uL (ref 0.0–0.7)
Eosinophils Relative: 3 %
HCT: 27.5 % — ABNORMAL LOW (ref 39.0–52.0)
Hemoglobin: 9.4 g/dL — ABNORMAL LOW (ref 13.0–17.0)
Lymphocytes Relative: 37 %
Lymphs Abs: 1.6 10*3/uL (ref 0.7–4.0)
MCH: 31 pg (ref 26.0–34.0)
MCHC: 34.2 g/dL (ref 30.0–36.0)
MCV: 90.8 fL (ref 78.0–100.0)
Monocytes Absolute: 0.5 10*3/uL (ref 0.1–1.0)
Monocytes Relative: 11 %
Neutro Abs: 2.2 10*3/uL (ref 1.7–7.7)
Neutrophils Relative %: 49 %
Platelets: 108 10*3/uL — ABNORMAL LOW (ref 150–400)
RBC: 3.03 MIL/uL — ABNORMAL LOW (ref 4.22–5.81)
RDW: 12.3 % (ref 11.5–15.5)
WBC: 4.4 10*3/uL (ref 4.0–10.5)

## 2016-03-05 LAB — COMPREHENSIVE METABOLIC PANEL
ALT: 22 U/L (ref 17–63)
AST: 26 U/L (ref 15–41)
Albumin: 3.3 g/dL — ABNORMAL LOW (ref 3.5–5.0)
Alkaline Phosphatase: 59 U/L (ref 38–126)
Anion gap: 5 (ref 5–15)
BUN: 8 mg/dL (ref 6–20)
CO2: 27 mmol/L (ref 22–32)
Calcium: 8.4 mg/dL — ABNORMAL LOW (ref 8.9–10.3)
Chloride: 107 mmol/L (ref 101–111)
Creatinine, Ser: 0.67 mg/dL (ref 0.61–1.24)
GFR calc Af Amer: >60 mL/min (ref >60)
GFR calc non Af Amer: >60 mL/min (ref >60)
Glucose, Bld: 123 mg/dL — ABNORMAL HIGH (ref 65–99)
Potassium: 3.9 mmol/L (ref 3.5–5.1)
Sodium: 139 mmol/L (ref 135–145)
Total Bilirubin: 1 mg/dL (ref 0.3–1.2)
Total Protein: 5.6 g/dL — ABNORMAL LOW (ref 6.5–8.1)

## 2016-03-05 LAB — HEMOGLOBIN AND HEMATOCRIT, BLOOD
HCT: 29.1 % — ABNORMAL LOW (ref 39.0–52.0)
Hemoglobin: 10.5 g/dL — ABNORMAL LOW (ref 13.0–17.0)

## 2016-03-05 LAB — PHOSPHORUS: Phosphorus: 3.4 mg/dL (ref 2.5–4.6)

## 2016-03-05 LAB — MAGNESIUM: Magnesium: 1.7 mg/dL (ref 1.7–2.4)

## 2016-03-05 MED ORDER — PANTOPRAZOLE SODIUM 40 MG PO TBEC
40.0000 mg | DELAYED_RELEASE_TABLET | Freq: Two times a day (BID) | ORAL | Status: DC
  Administered 2016-03-05 – 2016-03-10 (×10): 40 mg via ORAL
  Filled 2016-03-05 (×10): qty 1

## 2016-03-05 NOTE — Progress Notes (Signed)
     [facility] Progress Note  CC:  GI bleed  Subjective:  No BM or sign of bleeding since colonoscopy.  EGD 1/18 revealed erosive gastropathy and duodenopathy.  Colonoscopy revealed diverticulosis in the distal ascending colon that was friable and ulcerated, looked to be site of bleeding; clipped and tattooed.  Objective:  Vital signs in last 24 hours: Temp:  [97.9 F (36.6 C)-99.1 F (37.3 C)] 98.5 F (36.9 C) (01/19 0704) Pulse Rate:  [61-88] 75 (01/19 0704) Resp:  [11-16] 16 (01/19 0704) BP: (110-117)/(53-81) 117/70 (01/19 0704) SpO2:  [98 %-100 %] 98 % (01/19 0704) Last BM Date: 03/04/16 General:  Alert, Well-developed, in NAD Heart:  Regular rate and rhythm; no murmurs Pulm:  CTAB.  No increased WOB. Abdomen:  Soft, non-distended.  BS present.  Non-tender. Extremities:  Without edema. Neurologic:  Alert and oriented x 4;  grossly normal neurologically. Psych:  Alert and cooperative. Normal mood and affect.  Intake/Output from previous day: 01/18 0701 - 01/19 0700 In: 3250 [I.V.:3250] Out: 650 [Urine:650]   Lab Results:  Recent Labs  03/03/16 1416  03/04/16 0244 03/04/16 1123 03/04/16 1433 03/05/16 0506  WBC 12.6*  --  8.2  --   --  4.4  HGB 13.1  < > 10.8* 10.2* 10.2* 9.4*  HCT 38.3*  < > 31.3* 29.4* 29.7* 27.5*  PLT 158  --  128*  --   --  108*  < > = values in this interval not displayed. BMET  Recent Labs  03/03/16 1416 03/04/16 0244 03/05/16 0506  NA 140 139 139  K 3.8 4.2 3.9  CL 107 109 107  CO2 25 24 27   GLUCOSE 121* 138* 123*  BUN 20 19 8   CREATININE 0.74 0.70 0.67  CALCIUM 9.2 8.5* 8.4*   LFT  Recent Labs  03/05/16 0506  PROT 5.6*  ALBUMIN 3.3*  AST 26  ALT 22  ALKPHOS 59  BILITOT 1.0   PT/INR  Recent Labs  03/03/16 1416  LABPROT 15.0  INR 1.17   Assessment / Plan: -Diverticular bleed:  Colonoscopy 1/18 revealed diverticulosis in the distal ascending colon that was friable and ulcerated, looked to be site  of bleeding; clipped and tattooed.  No sign of bleeding since procedure.  Will advance diet to regular today.  If he does well and Hgb stable tomorrow AM then anticipate discharge at that time. -ABLA:  Hgb 14.9 grams on admission and 9.4 grams this AM.  Hgb down about a gram from yesterday.  No sign of further bleeding.   LOS: 2 days   Frazer [place] D.  03/05/2016, 9:44 AM  Pager number [phone]

## 2016-03-05 NOTE — Progress Notes (Signed)
PROGRESS NOTE    Aaron Wilkerson  JXB:[id number] DOB: [birth year] DOA: 03/03/2016 PCP: Kristian CoveyBURCHETTE,BRUCE W, MD   Brief Narrative:  Aaron Wilkerson is a 46 y.o. male with no significant medical history, who presents to the hospital chief complaint of rectal bleeding. Approximately 10 AM yesterday morning he noticed significant bright red blood per rectum, it has been persistent, with no improving or worsening factors, it has been associated with nausea, mild lower abdominal pain, dizziness and orthostatic symptoms. Patient suffered syncope while going from his bedroom to the bathroom, experiencing head trauma. So far patient had at least 3 large bloody bowel movements. Due to his persistent symptoms he came to the hospital for further evaluation. Was worked up and admitted for Lower GI bleed and was evaluated by Gastroenterology and underwent Upper Endoscopy and Colonoscopy yesterday. Has not had a bowel movement since colonoscopy; Diet advanced to regular diet by Gastroenterology.   Assessment & Plan:   Principal Problem:   GI bleeding Active Problems:   Rectal bleeding   Acute blood loss anemia   Erosive gastropathy   Syncope   Diverticulosis of colon with hemorrhage  Acute Lower GI bleed Likely 2/2 to Diverticulosis -Patient's Hb/Hct went from 13.1/38.3 -> 10.2/29.4 -> 9.4/27.5 -Appreciate Gastroenterology Evaluation and recc's -FOBT Positive -Patent is s/p Colonoscopy: Showed Blood in the entire examined colon and diverticulosis in the distal ascending colon was noted and was suspected to be the site of bleeding. Clips were place and it was tattooed with Carbon Black. The examined portion of the ileum was normal and patient was found to have non-bleeding internal hemorrhiods. Otherwise examination was normal and bowel prep was inadequate for screening purposes and repeat colonoscopy was recommended at age 46 -Patient is also s/p Endoscopy to R/o Upper GI Bleed: Showed Esophagogastric  landmarks identified, with normal esophagus, erosive gastropathy, and Erythematous duodenopathy and suspected changes in the stomach and duodenum due to NSAID use. Will r/o H. Pylori. -GI advised to place on Clear Liquid Diet and monitor serial H/H's and also advised to monitor for reccurence of bleeding and if it does re-occur consider CT-Angio or Tagged RBC cell scan to further evaluate.  -Repeat H/H this Afternoon -D/C'd Supportive Care with IVF of D5W NS at 75 mL/hr as drop in H/H could have been potentially dilutional;  -Clear Liquid Diet advanced to Regular Diet -IV Pantoprazole 40 mg IV q12h changed to po 40 mg BID -Repeat CBC in AM  Acute Blood Loss Anemia -Patient's Hb/Hct went from 13.1/38.3 -> 10.2/29.4 -> 9.4/27.5 -D/C'd IVF  -As Above  Erosive Gastropathy and Erythematous Duodenopathy -EGD Findings as above -Changed PPI 40 mg IV q12h to 40 mg po BID -H Pylori Serology ordered by Gastroenterology  Syncope  -Likely from Acute Blood Loss/Orthostasis/Vasovagal  -IVF Rehydration and obtain orthostatic VS in Am -Continue to Monitor  Anxiety -Continue Clonazepam 0.5 mg po BIDprn  DVT prophylaxis: SCDs Code Status: FULL CODE Family Communication: No Family present at bedside Disposition Plan: Home Likely when stable for D/C  Consultants:   Gastroenterology   Procedures: Upper Endoscopy and Colonoscopy   Antimicrobials: None  Subjective: Seen and examined and stated he was passing a lot of gas but no Bm's. No nausea/vomiting or abdominal pain. No other concerns or complaints and no Dizziness or Lightheadedness.  Objective: Vitals:   03/04/16 1500 03/04/16 2029 03/05/16 0704 03/05/16 1250  BP: 116/81 113/80 117/70 115/72  Pulse: 63 70 75 77  Resp: 16 16 16 18   Temp: 99.1  F (37.3 C) 97.9 F (36.6 C) 98.5 F (36.9 C) 98.1 F (36.7 C)  TempSrc: Oral Oral Oral Oral  SpO2: 100% 100% 98% 99%  Weight:      Height:        Intake/Output Summary (Last 24 hours)  at 03/05/16 1448 Last data filed at 03/05/16 0600  Gross per 24 hour  Intake             1200 ml  Output                0 ml  Net             1200 ml   Filed Weights   03/03/16 1358 03/03/16 2052 03/04/16 0848  Weight: 90.7 kg (200 lb) 89.4 kg (197 lb 1.5 oz) 89.4 kg (197 lb)   Examination: Physical Exam:  Constitutional: WN/WD, NAD and appears calm and comfortable Eyes: Lids and conjunctivae normal, sclerae anicteric  ENMT: External Ears, Nose appear normal. Grossly normal hearing. Neck: Appears normal, supple, no cervical masses, normal ROM, no appreciable thyromegaly, no JVD Respiratory: Clear to auscultation bilaterally, no wheezing, rales, rhonchi or crackles. Normal respiratory effort and patient is not tachypenic. No accessory muscle use.  Cardiovascular: RRR, no murmurs / rubs / gallops. S1 and S2 auscultated. No extremity edema.  Abdomen: Soft, non-tender, mildly disenteded. No masses palpated. No appreciable hepatosplenomegaly. Bowel sounds positive x4.  GU: Deferred. Musculoskeletal: No clubbing / cyanosis of digits/nails. No joint deformity upper and lower extremities.  Skin: No rashes, lesions, ulcers. No induration; Warm and dry.  Neurologic: CN 2-12 grossly intact with no focal deficits. Strength 5/5 in all 4. Romberg sign cerebellar reflexes not assessed.  Psychiatric: Normal judgment and insight. Alert and oriented x 3. Normal mood and appropriate affect.   Data Reviewed: I have personally reviewed following labs and imaging studies  CBC:  Recent Labs Lab 03/03/16 1416 03/03/16 2053 03/04/16 0244 03/04/16 1123 03/04/16 1433 03/05/16 0506  WBC 12.6*  --  8.2  --   --  4.4  NEUTROABS 10.3*  --   --   --   --  2.2  HGB 13.1 11.3* 10.8* 10.2* 10.2* 9.4*  HCT 38.3* 33.0* 31.3* 29.4* 29.7* 27.5*  MCV 91.6  --  91.5  --   --  90.8  PLT 158  --  128*  --   --  108*   Basic Metabolic Panel:  Recent Labs Lab 03/03/16 1416 03/04/16 0244 03/05/16 0506  NA 140  139 139  K 3.8 4.2 3.9  CL 107 109 107  CO2 25 24 27   GLUCOSE 121* 138* 123*  BUN 20 19 8   CREATININE 0.74 0.70 0.67  CALCIUM 9.2 8.5* 8.4*  MG  --   --  1.7  PHOS  --   --  3.4   GFR: Estimated Creatinine Clearance: 129.9 mL/min (by C-G formula based on SCr of 0.67 mg/dL). Liver Function Tests:  Recent Labs Lab 03/03/16 1416 03/04/16 0244 03/05/16 0506  AST 32 24 26  ALT 26 22 22   ALKPHOS 101 73 59  BILITOT 0.8 0.9 1.0  PROT 7.1 6.1* 5.6*  ALBUMIN 4.2 3.6 3.3*   No results for input(s): LIPASE, AMYLASE in the last 168 hours. No results for input(s): AMMONIA in the last 168 hours. Coagulation Profile:  Recent Labs Lab 03/03/16 1416  INR 1.17   Cardiac Enzymes: No results for input(s): CKTOTAL, CKMB, CKMBINDEX, TROPONINI in the last 168 hours. BNP (last 3 results)  No results for input(s): PROBNP in the last 8760 hours. HbA1C: No results for input(s): HGBA1C in the last 72 hours. CBG:  Recent Labs Lab 03/03/16 1426  GLUCAP 106*   Lipid Profile: No results for input(s): CHOL, HDL, LDLCALC, TRIG, CHOLHDL, LDLDIRECT in the last 72 hours. Thyroid Function Tests: No results for input(s): TSH, T4TOTAL, FREET4, T3FREE, THYROIDAB in the last 72 hours. Anemia Panel: No results for input(s): VITAMINB12, FOLATE, FERRITIN, TIBC, IRON, RETICCTPCT in the last 72 hours. Sepsis Labs: No results for input(s): PROCALCITON, LATICACIDVEN in the last 168 hours.  No results found for this or any previous visit (from the past 240 hour(s)).   Radiology Studies: No results found.   Upper GI Endoscopy                           - Esophagogastric landmarks identified.                           - Normal esophagus otherwise                           - Erosive gastropathy.                           - Erythematous duodenopathy.                           Suspect changes in the stomach and duodenum due to                            NSAIDs, but would rule out H  pylori.  Colonoscopy       - Preparation of the colon was fair.                           - Blood in the entire examined colon.                           - Diverticulosis in the distal ascending colon,                            suspect this may have been the site of bleeding.                            Clips were placed. Tattooed.                           - The examined portion of the ileum was normal.                           - Non-bleeding internal hemorrhoids.                           - The examination was otherwise normal. Bowel prep                            was inadequate for screening purposes.   Scheduled Meds: . pantoprazole  40 mg  Intravenous Q12H  . sodium chloride flush  3 mL Intravenous Q12H  . thiamine  100 mg Oral Daily   Continuous Infusions: . dextrose 5 % and 0.9% NaCl 75 mL/hr at 03/04/16 1524    LOS: 2 days   Merlene Laughter, DO [facility] Pager [phone]  If 7PM-7AM, please contact night-coverage [email or web address] Password [email or web address] 03/05/2016, 2:48 PM

## 2016-03-06 DIAGNOSIS — K5791 Diverticulosis of intestine, part unspecified, without perforation or abscess with bleeding: Secondary | ICD-10-CM

## 2016-03-06 LAB — HEMOGLOBIN AND HEMATOCRIT, BLOOD
HCT: 27.4 % — ABNORMAL LOW (ref 39.0–52.0)
Hemoglobin: 9.6 g/dL — ABNORMAL LOW (ref 13.0–17.0)

## 2016-03-06 LAB — COMPREHENSIVE METABOLIC PANEL
ALT: 24 U/L (ref 17–63)
AST: 27 U/L (ref 15–41)
Albumin: 3.6 g/dL (ref 3.5–5.0)
Alkaline Phosphatase: 76 U/L (ref 38–126)
Anion gap: 5 (ref 5–15)
BUN: 8 mg/dL (ref 6–20)
CO2: 27 mmol/L (ref 22–32)
Calcium: 8.5 mg/dL — ABNORMAL LOW (ref 8.9–10.3)
Chloride: 107 mmol/L (ref 101–111)
Creatinine, Ser: 0.75 mg/dL (ref 0.61–1.24)
GFR calc Af Amer: >60 mL/min (ref >60)
GFR calc non Af Amer: >60 mL/min (ref >60)
Glucose, Bld: 118 mg/dL — ABNORMAL HIGH (ref 65–99)
Potassium: 3.9 mmol/L (ref 3.5–5.1)
Sodium: 139 mmol/L (ref 135–145)
Total Bilirubin: 0.7 mg/dL (ref 0.3–1.2)
Total Protein: 5.7 g/dL — ABNORMAL LOW (ref 6.5–8.1)

## 2016-03-06 LAB — CBC WITH DIFFERENTIAL/PLATELET
Basophils Absolute: 0 10*3/uL (ref 0.0–0.1)
Basophils Relative: 1 %
Eosinophils Absolute: 0.1 10*3/uL (ref 0.0–0.7)
Eosinophils Relative: 3 %
HCT: 27.7 % — ABNORMAL LOW (ref 39.0–52.0)
Hemoglobin: 9.6 g/dL — ABNORMAL LOW (ref 13.0–17.0)
Lymphocytes Relative: 35 %
Lymphs Abs: 1.7 10*3/uL (ref 0.7–4.0)
MCH: 31.8 pg (ref 26.0–34.0)
MCHC: 34.7 g/dL (ref 30.0–36.0)
MCV: 91.7 fL (ref 78.0–100.0)
Monocytes Absolute: 0.4 10*3/uL (ref 0.1–1.0)
Monocytes Relative: 9 %
Neutro Abs: 2.5 10*3/uL (ref 1.7–7.7)
Neutrophils Relative %: 53 %
Platelets: 102 10*3/uL — ABNORMAL LOW (ref 150–400)
RBC: 3.02 MIL/uL — ABNORMAL LOW (ref 4.22–5.81)
RDW: 12.4 % (ref 11.5–15.5)
WBC: 4.7 10*3/uL (ref 4.0–10.5)

## 2016-03-06 LAB — MAGNESIUM: Magnesium: 1.8 mg/dL (ref 1.7–2.4)

## 2016-03-06 LAB — PHOSPHORUS: Phosphorus: 4.1 mg/dL (ref 2.5–4.6)

## 2016-03-06 MED ORDER — PANTOPRAZOLE SODIUM 40 MG PO TBEC: 40.0000 mg | DELAYED_RELEASE_TABLET | Freq: Every day | ORAL | 0 refills | Status: DC

## 2016-03-06 MED ORDER — SODIUM CHLORIDE 0.9 % IV BOLUS (SEPSIS)
1000.0000 mL | Freq: Once | INTRAVENOUS | Status: AC
  Administered 2016-03-06: 1000 mL via INTRAVENOUS

## 2016-03-06 MED ORDER — SODIUM CHLORIDE 0.9 % IV SOLN
INTRAVENOUS | Status: DC
  Administered 2016-03-06 – 2016-03-10 (×5): via INTRAVENOUS

## 2016-03-06 NOTE — Progress Notes (Signed)
PROGRESS NOTE   Aaron Wilkerson  EAV:[id number] DOB: Oct 02, 1970 DOA: 03/03/2016 PCP: Kristian CoveyBURCHETTE,BRUCE W, MD   Brief Narrative:  Aaron Wilkerson is a 46 y.o. male with no significant medical history, who presents to the hospital chief complaint of rectal bleeding. Approximately 10 AM yesterday morning he noticed significant bright red blood per rectum, it has been persistent, with no improving or worsening factors, it has been associated with nausea, mild lower abdominal pain, dizziness and orthostatic symptoms. Patient suffered syncope while going from his bedroom to the bathroom, experiencing head trauma. So far patient had at least 3 large bloody bowel movements. Due to his persistent symptoms he came to the hospital for further evaluation. Was worked up and admitted for Lower GI bleed and was evaluated by Gastroenterology and underwent Upper Endoscopy and Colonoscopy yesterday. Has not had a bowel movement since colonoscopy; Diet advanced to regular diet by Gastroenterology. Patient was going to be D/C'd today however became suddenly dizzy and nauseous upon standing so discharge was canceled and patient was bolused 1 liter and held to re-evaluate in AM and repeat Orthostatics.   Assessment & Plan:   Principal Problem:   GI bleeding Active Problems:   Rectal bleeding   Acute blood loss anemia   Erosive gastropathy   Syncope   Diverticulosis of colon with hemorrhage  Acute Lower GI bleed Likely 2/2 to Diverticulosis -Patient's Hb/Hct went from 13.1/38.3 -> 10.2/29.4 -> 9.4/27.5 -> 9.6/27.7 -Appreciate Gastroenterology Evaluation and recc's -FOBT Positive -Patent is s/p Colonoscopy: Showed Blood in the entire examined colon and diverticulosis in the distal ascending colon was noted and was suspected to be the site of bleeding. Clips were place and it was tattooed with Carbon Black. The examined portion of the ileum was normal and patient was found to have non-bleeding internal hemorrhiods.  Otherwise examination was normal and bowel prep was inadequate for screening purposes and repeat colonoscopy was recommended at age 46 -Patient is also s/p Endoscopy to R/o Upper GI Bleed: Showed Esophagogastric landmarks identified, with normal esophagus, erosive gastropathy, and Erythematous duodenopathy and suspected changes in the stomach and duodenum due to NSAID use. Will r/o H. Pylori. -CT-Angio or Tagged RBC cell scan to further evaluate if continues to bleed. *no evidence -Repeat H/H this Afternoon showed 9.6/27.4 -Started NS at 100 mL/hr again as patient became dizzy and nauseous on standing. BP dropped on standing and was borderline orthostatic.  -C/w  Regular Diet -C/w po Protonix 40 mg Daily -Repeat CBC in AM  Acute Blood Loss Anemia -Patient's Hb/Hct went from 13.1/38.3 -> 10.2/29.4 -> 9.4/27.5 -> 9.6/27.7 -Restarted IVF because of Borderline Orthostatic episode of Dizziness and Nausea on Standing -As Above  Erosive Gastropathy and Erythematous Duodenopathy -EGD Findings as above -C/w po Protonix -H Pylori Serology ordered and pending   Syncope/Near Syncope  -Likely from Acute Blood Loss/Orthostasis/Vasovagal  -Became Suddenly dizzy and nauseous prior to D/C; Orthostatic VS showed His systolic BP almost drop 20 points -D/C held and was bolused 1 Liter of NS and started on Maintenance 100 mL/hr -Repeat Orthostatics in AM and if ok can D/C -Ordered TED Hose -Continue to Monitor  Anxiety -Continue Clonazepam 0.5 mg po BIDprn  DVT prophylaxis: SCDs Code Status: FULL CODE Family Communication: Wife and son present at bedside  Disposition Plan: Home Likely when stable for D/C  Consultants:   Gastroenterology   Procedures: Upper Endoscopy and Colonoscopy   Antimicrobials: None  Subjective: Seen and examined and was doing well. Had no complaints this  Am and was going to be D/C'd home however just prior to discharge became dizzy and nauseous on standing and  orthostatics were borderline so D/C was held and he was bolused NS and placed on Maintence fluid.   Objective: Vitals:   03/05/16 1250 03/05/16 2237 03/06/16 0524 03/06/16 1453  BP: 115/72 (!) 145/83 117/77 (!) 137/96  Pulse: 77 71 73 71  Resp: 18 18 17 16   Temp: 98.1 F (36.7 C) 98.2 F (36.8 C) 98.2 F (36.8 C) 98.3 F (36.8 C)  TempSrc: Oral Oral Oral Oral  SpO2: 99% 100% 100% 100%  Weight:      Height:        Intake/Output Summary (Last 24 hours) at 03/06/16 1941 Last data filed at 03/06/16 1300  Gross per 24 hour  Intake              480 ml  Output                0 ml  Net              480 ml   Filed Weights   03/03/16 1358 03/03/16 2052 03/04/16 0848  Weight: 90.7 kg (200 lb) 89.4 kg (197 lb 1.5 oz) 89.4 kg (197 lb)   Examination: Physical Exam:  Constitutional: WN/WD, NAD and appears calm and comfortable Eyes: Lids and conjunctivae normal, sclerae anicteric  ENMT: External Ears, Nose appear normal. Grossly normal hearing. Neck: Appears normal, supple, no cervical masses, normal ROM, no appreciable thyromegaly, no JVD Respiratory: Clear to auscultation bilaterally, no wheezing, rales, rhonchi or crackles. Normal respiratory effort and patient is not tachypenic. No accessory muscle use.  Cardiovascular: RRR, no murmurs / rubs / gallops. S1 and S2 auscultated. No extremity edema.  Abdomen: Soft, non-tender, mildly disenteded. No masses palpated. No appreciable hepatosplenomegaly. Bowel sounds positive x4.  GU: Deferred. Musculoskeletal: No clubbing / cyanosis of digits/nails. No joint deformity upper and lower extremities.  Skin: No rashes, lesions, ulcers. No induration; Warm and dry.  Neurologic: CN 2-12 grossly intact with no focal deficits. Strength 5/5 in all 4. Romberg sign cerebellar reflexes not assessed.  Psychiatric: Normal judgment and insight. Alert and oriented x 3. Normal mood and appropriate affect.   Data Reviewed: I have personally reviewed following  labs and imaging studies  CBC:  Recent Labs Lab 03/03/16 1416  03/04/16 0244  03/04/16 1433 03/05/16 0506 03/05/16 1514 03/06/16 0530 03/06/16 1219  WBC 12.6*  --  8.2  --   --  4.4  --  4.7  --   NEUTROABS 10.3*  --   --   --   --  2.2  --  2.5  --   HGB 13.1  < > 10.8*  < > 10.2* 9.4* 10.5* 9.6* 9.6*  HCT 38.3*  < > 31.3*  < > 29.7* 27.5* 29.1* 27.7* 27.4*  MCV 91.6  --  91.5  --   --  90.8  --  91.7  --   PLT 158  --  128*  --   --  108*  --  102*  --   < > = values in this interval not displayed. Basic Metabolic Panel:  Recent Labs Lab 03/03/16 1416 03/04/16 0244 03/05/16 0506 03/06/16 0530  NA 140 139 139 139  K 3.8 4.2 3.9 3.9  CL 107 109 107 107  CO2 25 24 27 27   GLUCOSE 121* 138* 123* 118*  BUN 20 19 8 8   CREATININE 0.74 0.70 0.67  0.75  CALCIUM 9.2 8.5* 8.4* 8.5*  MG  --   --  1.7 1.8  PHOS  --   --  3.4 4.1   GFR: Estimated Creatinine Clearance: 129.9 mL/min (by C-G formula based on SCr of 0.75 mg/dL). Liver Function Tests:  Recent Labs Lab 03/03/16 1416 03/04/16 0244 03/05/16 0506 03/06/16 0530  AST 32 24 26 27   ALT 26 22 22 24   ALKPHOS 101 73 59 76  BILITOT 0.8 0.9 1.0 0.7  PROT 7.1 6.1* 5.6* 5.7*  ALBUMIN 4.2 3.6 3.3* 3.6   No results for input(s): LIPASE, AMYLASE in the last 168 hours. No results for input(s): AMMONIA in the last 168 hours. Coagulation Profile:  Recent Labs Lab 03/03/16 1416  INR 1.17   Cardiac Enzymes: No results for input(s): CKTOTAL, CKMB, CKMBINDEX, TROPONINI in the last 168 hours. BNP (last 3 results) No results for input(s): PROBNP in the last 8760 hours. HbA1C: No results for input(s): HGBA1C in the last 72 hours. CBG:  Recent Labs Lab 03/03/16 1426  GLUCAP 106*   Lipid Profile: No results for input(s): CHOL, HDL, LDLCALC, TRIG, CHOLHDL, LDLDIRECT in the last 72 hours. Thyroid Function Tests: No results for input(s): TSH, T4TOTAL, FREET4, T3FREE, THYROIDAB in the last 72 hours. Anemia Panel: No  results for input(s): VITAMINB12, FOLATE, FERRITIN, TIBC, IRON, RETICCTPCT in the last 72 hours. Sepsis Labs: No results for input(s): PROCALCITON, LATICACIDVEN in the last 168 hours.  No results found for this or any previous visit (from the past 240 hour(s)).   Radiology Studies: No results found.   Upper GI Endoscopy                           - Esophagogastric landmarks identified.                           - Normal esophagus otherwise                           - Erosive gastropathy.                           - Erythematous duodenopathy.                           Suspect changes in the stomach and duodenum due to                            NSAIDs, but would rule out H pylori.  Colonoscopy       - Preparation of the colon was fair.                           - Blood in the entire examined colon.                           - Diverticulosis in the distal ascending colon,                            suspect this may have been the site of bleeding.  Clips were placed. Tattooed.                           - The examined portion of the ileum was normal.                           - Non-bleeding internal hemorrhoids.                           - The examination was otherwise normal. Bowel prep                            was inadequate for screening purposes.   Scheduled Meds: . pantoprazole  40 mg Oral BID  . sodium chloride flush  3 mL Intravenous Q12H  . thiamine  100 mg Oral Daily   Continuous Infusions: . sodium chloride 100 mL/hr at 03/06/16 1419    LOS: 3 days   Merlene Laughter, DO [facility] Pager [phone]  If 7PM-7AM, please contact night-coverage [email or web address] Password [email or web address] 03/06/2016, 7:41 PM

## 2016-03-06 NOTE — Progress Notes (Signed)
Pt called out complaining of sudden onset dizziness and nausea. RN assessed, took orthostatic vitals at 1120. Pt not hypotensive but blood pressure dropped from 160/104 sitting to 144/98 standing. Pt reported severe dizziness upon standing. Pt back in bed, slightly anxious, but otherwise at baseline. MD made aware of findings and discharge orders cancelled. 1L NS bolus ordered and administered. Will continue to monitor.   Sinclair GroomsNivi Caria Transue, RN

## 2016-03-06 NOTE — Progress Notes (Signed)
      Progress Note   Subjective  Patient did well overnight. Tolerated diet. No symptoms of bleeding, no bowel movements.    Objective   Vital signs in last 24 hours: Temp:  [98.1 F (36.7 C)-98.2 F (36.8 C)] 98.2 F (36.8 C) (01/20 0524) Pulse Rate:  [71-77] 73 (01/20 0524) Resp:  [17-18] 17 (01/20 0524) BP: (115-145)/(72-83) 117/77 (01/20 0524) SpO2:  [99 %-100 %] 100 % (01/20 0524) Last BM Date: 03/04/16 General:    white male in NAD Heart:  Regular rate and rhythm; no murmurs Lungs: Respirations even and unlabored, lungs CTA bilaterally Abdomen:  Soft, nontender and nondistended. . Extremities:  Without edema. Neurologic:  Alert and oriented,  grossly normal neurologically. Psych:  Cooperative. Normal mood and affect.  Intake/Output from previous day: 01/19 0701 - 01/20 0700 In: 600 [P.O.:600] Out: 350 [Urine:350] Intake/Output this shift: No intake/output data recorded.  Lab Results:  Recent Labs  03/04/16 0244  03/05/16 0506 03/05/16 1514 03/06/16 0530  WBC 8.2  --  4.4  --  4.7  HGB 10.8*  < > 9.4* 10.5* 9.6*  HCT 31.3*  < > 27.5* 29.1* 27.7*  PLT 128*  --  108*  --  102*  < > = values in this interval not displayed. BMET  Recent Labs  03/04/16 0244 03/05/16 0506 03/06/16 0530  NA 139 139 139  K 4.2 3.9 3.9  CL 109 107 107  CO2 24 27 27   GLUCOSE 138* 123* 118*  BUN 19 8 8   CREATININE 0.70 0.67 0.75  CALCIUM 8.5* 8.4* 8.5*   LFT  Recent Labs  03/06/16 0530  PROT 5.7*  ALBUMIN 3.6  AST 27  ALT 24  ALKPHOS 76  BILITOT 0.7   PT/INR  Recent Labs  03/03/16 1416  LABPROT 15.0  INR 1.17    Studies/Results: No results found.     Assessment / Plan:   46 y/o male who presented with acute GI bleeding. EGD showed gastric erosion and mild duodenitis, likely due to NSAIDs. Colonoscopy showed diverticulum in distal ascending colon / hepatic flexure which appeared ulcerated and site of bleeding, s/p 2 hemostasis clips, tattooed. No  further bleeding since the intervention and tolerating diet. He otherwise does endorse reflux symptoms at times, asking for therapy for this.   Recommend: - okay for discharge this morning - avoidance of NSAIDs - H pylori IgG, treat if positive - trial of protonix 40mg  once daily for heartburn and findings on EGD - patient can follow up as needed in our office.  - repeat colonoscopy at age 46 for screening purposes  Please call with questions.   Ileene PatrickSteven Armbruster, MD [facility] Pager [phone]

## 2016-03-07 ENCOUNTER — Inpatient Hospital Stay (HOSPITAL_COMMUNITY): Payer: BLUE CROSS/BLUE SHIELD

## 2016-03-07 LAB — CBC WITH DIFFERENTIAL/PLATELET
Basophils Absolute: 0 10*3/uL (ref 0.0–0.1)
Basophils Relative: 1 %
Eosinophils Absolute: 0.1 10*3/uL (ref 0.0–0.7)
Eosinophils Relative: 3 %
HCT: 28 % — ABNORMAL LOW (ref 39.0–52.0)
Hemoglobin: 9.6 g/dL — ABNORMAL LOW (ref 13.0–17.0)
Lymphocytes Relative: 33 %
Lymphs Abs: 1.6 10*3/uL (ref 0.7–4.0)
MCH: 31.5 pg (ref 26.0–34.0)
MCHC: 34.3 g/dL (ref 30.0–36.0)
MCV: 91.8 fL (ref 78.0–100.0)
Monocytes Absolute: 0.4 10*3/uL (ref 0.1–1.0)
Monocytes Relative: 9 %
Neutro Abs: 2.5 10*3/uL (ref 1.7–7.7)
Neutrophils Relative %: 54 %
Platelets: 122 10*3/uL — ABNORMAL LOW (ref 150–400)
RBC: 3.05 MIL/uL — ABNORMAL LOW (ref 4.22–5.81)
RDW: 12.5 % (ref 11.5–15.5)
WBC: 4.7 10*3/uL (ref 4.0–10.5)

## 2016-03-07 LAB — COMPREHENSIVE METABOLIC PANEL
ALT: 24 U/L (ref 17–63)
AST: 26 U/L (ref 15–41)
Albumin: 3.5 g/dL (ref 3.5–5.0)
Alkaline Phosphatase: 75 U/L (ref 38–126)
Anion gap: 5 (ref 5–15)
BUN: 9 mg/dL (ref 6–20)
CO2: 27 mmol/L (ref 22–32)
Calcium: 8.8 mg/dL — ABNORMAL LOW (ref 8.9–10.3)
Chloride: 107 mmol/L (ref 101–111)
Creatinine, Ser: 0.75 mg/dL (ref 0.61–1.24)
GFR calc Af Amer: >60 mL/min (ref >60)
GFR calc non Af Amer: >60 mL/min (ref >60)
Glucose, Bld: 123 mg/dL — ABNORMAL HIGH (ref 65–99)
Potassium: 4.2 mmol/L (ref 3.5–5.1)
Sodium: 139 mmol/L (ref 135–145)
Total Bilirubin: 0.6 mg/dL (ref 0.3–1.2)
Total Protein: 6 g/dL — ABNORMAL LOW (ref 6.5–8.1)

## 2016-03-07 LAB — PHOSPHORUS: Phosphorus: 4.3 mg/dL (ref 2.5–4.6)

## 2016-03-07 LAB — MAGNESIUM: Magnesium: 1.8 mg/dL (ref 1.7–2.4)

## 2016-03-07 MED ORDER — TRAMADOL HCL 50 MG PO TABS
50.0000 mg | ORAL_TABLET | Freq: Four times a day (QID) | ORAL | Status: DC | PRN
Start: 2016-03-07 — End: 2016-03-10
  Administered 2016-03-07 – 2016-03-09 (×6): 50 mg via ORAL
  Filled 2016-03-07 (×6): qty 1

## 2016-03-07 MED ORDER — SODIUM CHLORIDE 0.9 % IV BOLUS (SEPSIS)
1000.0000 mL | Freq: Once | INTRAVENOUS | Status: AC
  Administered 2016-03-07: 1000 mL via INTRAVENOUS

## 2016-03-07 MED ORDER — DIPHENHYDRAMINE HCL 50 MG/ML IJ SOLN
25.0000 mg | Freq: Once | INTRAMUSCULAR | Status: AC
  Administered 2016-03-08: 25 mg via INTRAVENOUS
  Filled 2016-03-07: qty 1

## 2016-03-07 MED ORDER — METOCLOPRAMIDE HCL 5 MG/ML IJ SOLN
10.0000 mg | Freq: Once | INTRAMUSCULAR | Status: AC
  Administered 2016-03-08: 10 mg via INTRAVENOUS
  Filled 2016-03-07: qty 2

## 2016-03-07 MED ORDER — SODIUM CHLORIDE 0.9 % IV BOLUS (SEPSIS)
500.0000 mL | Freq: Once | INTRAVENOUS | Status: AC
Start: 2016-03-07 — End: 2016-03-08
  Administered 2016-03-07: 500 mL via INTRAVENOUS

## 2016-03-07 MED ORDER — KETOROLAC TROMETHAMINE 30 MG/ML IJ SOLN
30.0000 mg | Freq: Once | INTRAMUSCULAR | Status: AC
Start: 2016-03-07 — End: 2016-03-08
  Administered 2016-03-08: 30 mg via INTRAVENOUS
  Filled 2016-03-07: qty 1

## 2016-03-07 NOTE — Progress Notes (Signed)
PROGRESS NOTE   Aaron Wilkerson  ZOX:[id number] DOB: 1970/11/24 DOA: 03/03/2016 PCP: Kristian Covey, MD   Brief Narrative:  Aaron Wilkerson is a 46 y.o. male with no significant medical history, who presents to the hospital chief complaint of rectal bleeding. Approximately 10 AM yesterday morning he noticed significant bright red blood per rectum, it has been persistent, with no improving or worsening factors, it has been associated with nausea, mild lower abdominal pain, dizziness and orthostatic symptoms. Patient suffered syncope while going from his bedroom to the bathroom, experiencing head trauma. So far patient had at least 3 large bloody bowel movements. Due to his persistent symptoms he came to the hospital for further evaluation. Was worked up and admitted for Lower GI bleed and was evaluated by Gastroenterology and underwent Upper Endoscopy and Colonoscopy yesterday. Has not had a bowel movement since colonoscopy; Diet advanced to regular diet by Gastroenterology. Patient was going to be D/C'd today however became suddenly dizzy and nauseous upon standing so discharge was canceled and patient was bolused 1 liter and held to re-evaluate in AM and repeat Orthostatics. This AM patient remained dizzy and had a headache so a CT of the Head was ordered and he was bolused another liter of fluid. Maintence fluid was increased to 125 mL/hr and hour and TED hose were provided.   Assessment & Plan:   Principal Problem:   GI bleeding Active Problems:   Rectal bleeding   Acute blood loss anemia   Erosive gastropathy   Syncope   Diverticulosis of colon with hemorrhage  Acute Lower GI bleed Likely 2/2 to Diverticulosis -Patient's Hb/Hct went from 13.1/38.3 -> 10.2/29.4 -> 9.4/27.5 -> 9.6/27.7 -> 9.6/28.0 -Appreciate Gastroenterology Evaluation and recc's -FOBT Positive -Patent is s/p Colonoscopy: Showed Blood in the entire examined colon and diverticulosis in the distal ascending colon was noted  and was suspected to be the site of bleeding. Clips were place and it was tattooed with Carbon Black. The examined portion of the ileum was normal and patient was found to have non-bleeding internal hemorrhiods. Otherwise examination was normal and bowel prep was inadequate for screening purposes and repeat colonoscopy was recommended at age 15 -Patient is also s/p Endoscopy to R/o Upper GI Bleed: Showed Esophagogastric landmarks identified, with normal esophagus, erosive gastropathy, and Erythematous duodenopathy and suspected changes in the stomach and duodenum due to NSAID use. Will r/o H. Pylori. -CT-Angio or Tagged RBC cell scan to further evaluate if continues to bleed. *no evidence -Increased NS at 125 mL/hr again as patient became dizzy  -C/w  Regular Diet -C/w po Protonix 40 mg Daily -Repeat CBC in AM  Acute Blood Loss Anemia -Patient's Hb/Hct went from 13.1/38.3 -> 10.2/29.4 -> 9.4/27.5 -> 9.6/27.7 -> 9.6/28.0 -Increased IVF because of Borderline Orthostatic episode of Dizziness and Nausea on Standing yesterday; Patient still dizzy  -As Above  Erosive Gastropathy and Erythematous Duodenopathy -EGD Findings as above -C/w po Protonix -H Pylori Serology ordered and pending   Syncope/Near Syncope/Dizziness -Head CT w/o Contrast orderded -Likely from Acute Blood Loss/Orthostasis/Vasovagal  -Repeat Orthostatics  -D/C held again  and was bolused 1 Liter of NS and increased  Maintenance Fluid to 125 mL/hr -Repeat Orthostatics in AM and if ok can D/C -Ordered TED Hose -Continue to Monitor  Anxiety -Continue Clonazepam 0.5 mg po BIDprn  DVT prophylaxis: SCDs Code Status: FULL CODE Family Communication: No Family present at bedside Disposition Plan: Home Likely when stable for D/C; Possibly tomorrow  Consultants:   Gastroenterology  Procedures: Upper Endoscopy and Colonoscopy   Antimicrobials: None  Subjective: Seen and examined and was still dizzy and had a headache.  Wanted to go home but willing to stay until he improved. Head CT ordered and pending. No Nausea or Vomiting and tolerating diet. Still has not had a Bowel Movement.   Objective: Vitals:   03/07/16 0800 03/07/16 1151 03/07/16 1153 03/07/16 1155  BP: (!) 133/96 (!) 130/92    Pulse: 65 77    Resp:  18    Temp:  98 F (36.7 C) 98.3 F (36.8 C)   TempSrc:  Oral    SpO2:  100% 100% 100%  Weight:      Height:        Intake/Output Summary (Last 24 hours) at 03/07/16 1729 Last data filed at 03/07/16 0600  Gross per 24 hour  Intake          1568.33 ml  Output                0 ml  Net          1568.33 ml   Filed Weights   03/03/16 1358 03/03/16 2052 03/04/16 0848  Weight: 90.7 kg (200 lb) 89.4 kg (197 lb 1.5 oz) 89.4 kg (197 lb)   Examination: Physical Exam:  Constitutional: WN/WD, NAD and appears calm and comfortable in bed Eyes: Lids and conjunctivae normal, sclerae anicteric  ENMT: External Ears, Nose appear normal. Grossly normal hearing. Neck: Appears normal, supple, no cervical masses, normal ROM, no appreciable thyromegaly, no JVD Respiratory: Clear to auscultation bilaterally, no wheezing, rales, rhonchi or crackles. Normal respiratory effort and patient is not tachypenic. No accessory muscle use.  Cardiovascular: RRR, no murmurs / rubs / gallops. S1 and S2 auscultated. No extremity edema.  Abdomen: Soft, non-tender, mildly disenteded. No masses palpated. No appreciable hepatosplenomegaly. Bowel sounds positive x4.  GU: Deferred. Musculoskeletal: No clubbing / cyanosis of digits/nails. No joint deformity upper and lower extremities.  Skin: No rashes, lesions, ulcers. No induration; Warm and dry.  Neurologic: CN 2-12 grossly intact with no focal deficits. Strength 5/5 in all 4. Romberg sign cerebellar reflexes not assessed.  Psychiatric: Normal judgment and insight. Alert and oriented x 3. Normal mood and appropriate affect.   Data Reviewed: I have personally reviewed  following labs and imaging studies  CBC:  Recent Labs Lab 03/03/16 1416  03/04/16 0244  03/05/16 0506 03/05/16 1514 03/06/16 0530 03/06/16 1219 03/07/16 0535  WBC 12.6*  --  8.2  --  4.4  --  4.7  --  4.7  NEUTROABS 10.3*  --   --   --  2.2  --  2.5  --  2.5  HGB 13.1  < > 10.8*  < > 9.4* 10.5* 9.6* 9.6* 9.6*  HCT 38.3*  < > 31.3*  < > 27.5* 29.1* 27.7* 27.4* 28.0*  MCV 91.6  --  91.5  --  90.8  --  91.7  --  91.8  PLT 158  --  128*  --  108*  --  102*  --  122*  < > = values in this interval not displayed. Basic Metabolic Panel:  Recent Labs Lab 03/03/16 1416 03/04/16 0244 03/05/16 0506 03/06/16 0530 03/07/16 0535  NA 140 139 139 139 139  K 3.8 4.2 3.9 3.9 4.2  CL 107 109 107 107 107  CO2 25 24 27 27 27   GLUCOSE 121* 138* 123* 118* 123*  BUN 20 19 8 8 9   CREATININE  0.74 0.70 0.67 0.75 0.75  CALCIUM 9.2 8.5* 8.4* 8.5* 8.8*  MG  --   --  1.7 1.8 1.8  PHOS  --   --  3.4 4.1 4.3   GFR: Estimated Creatinine Clearance: 129.9 mL/min (by C-G formula based on SCr of 0.75 mg/dL). Liver Function Tests:  Recent Labs Lab 03/03/16 1416 03/04/16 0244 03/05/16 0506 03/06/16 0530 03/07/16 0535  AST 32 24 26 27 26   ALT 26 22 22 24 24   ALKPHOS 101 73 59 76 75  BILITOT 0.8 0.9 1.0 0.7 0.6  PROT 7.1 6.1* 5.6* 5.7* 6.0*  ALBUMIN 4.2 3.6 3.3* 3.6 3.5   No results for input(s): LIPASE, AMYLASE in the last 168 hours. No results for input(s): AMMONIA in the last 168 hours. Coagulation Profile:  Recent Labs Lab 03/03/16 1416  INR 1.17   Cardiac Enzymes: No results for input(s): CKTOTAL, CKMB, CKMBINDEX, TROPONINI in the last 168 hours. BNP (last 3 results) No results for input(s): PROBNP in the last 8760 hours. HbA1C: No results for input(s): HGBA1C in the last 72 hours. CBG:  Recent Labs Lab 03/03/16 1426  GLUCAP 106*   Lipid Profile: No results for input(s): CHOL, HDL, LDLCALC, TRIG, CHOLHDL, LDLDIRECT in the last 72 hours. Thyroid Function Tests: No results  for input(s): TSH, T4TOTAL, FREET4, T3FREE, THYROIDAB in the last 72 hours. Anemia Panel: No results for input(s): VITAMINB12, FOLATE, FERRITIN, TIBC, IRON, RETICCTPCT in the last 72 hours. Sepsis Labs: No results for input(s): PROCALCITON, LATICACIDVEN in the last 168 hours.  No results found for this or any previous visit (from the past 240 hour(s)).   Radiology Studies: No results found.   Upper GI Endoscopy                           - Esophagogastric landmarks identified.                           - Normal esophagus otherwise                           - Erosive gastropathy.                           - Erythematous duodenopathy.                           Suspect changes in the stomach and duodenum due to                            NSAIDs, but would rule out H pylori.  Colonoscopy       - Preparation of the colon was fair.                           - Blood in the entire examined colon.                           - Diverticulosis in the distal ascending colon,                            suspect this may have been the site of bleeding.  Clips were placed. Tattooed.                           - The examined portion of the ileum was normal.                           - Non-bleeding internal hemorrhoids.                           - The examination was otherwise normal. Bowel prep                            was inadequate for screening purposes.   Scheduled Meds: . pantoprazole  40 mg Oral BID  . sodium chloride flush  3 mL Intravenous Q12H  . thiamine  100 mg Oral Daily   Continuous Infusions: . sodium chloride 125 mL/hr at 03/07/16 1115    LOS: 4 days   Merlene Laughter, DO [facility] Pager [phone]  If 7PM-7AM, please contact night-coverage [email or web address] Password [email or web address] 03/07/2016, 5:29 PM

## 2016-03-08 ENCOUNTER — Inpatient Hospital Stay (HOSPITAL_COMMUNITY): Payer: BLUE CROSS/BLUE SHIELD

## 2016-03-08 ENCOUNTER — Encounter (HOSPITAL_COMMUNITY): Payer: Self-pay | Admitting: Gastroenterology

## 2016-03-08 DIAGNOSIS — R42 Dizziness and giddiness: Secondary | ICD-10-CM

## 2016-03-08 DIAGNOSIS — F1011 Alcohol abuse, in remission: Secondary | ICD-10-CM

## 2016-03-08 DIAGNOSIS — R55 Syncope and collapse: Secondary | ICD-10-CM

## 2016-03-08 HISTORY — DX: Alcohol abuse, in remission: F10.11

## 2016-03-08 LAB — CBC WITH DIFFERENTIAL/PLATELET
Basophils Absolute: 0 10*3/uL (ref 0.0–0.1)
Basophils Relative: 0 %
Eosinophils Absolute: 0.1 10*3/uL (ref 0.0–0.7)
Eosinophils Relative: 1 %
HCT: 19.9 % — ABNORMAL LOW (ref 39.0–52.0)
Hemoglobin: 6.9 g/dL — CL (ref 13.0–17.0)
Lymphocytes Relative: 33 %
Lymphs Abs: 1.9 10*3/uL (ref 0.7–4.0)
MCH: 31.1 pg (ref 26.0–34.0)
MCHC: 34.7 g/dL (ref 30.0–36.0)
MCV: 89.6 fL (ref 78.0–100.0)
Monocytes Absolute: 0.3 10*3/uL (ref 0.1–1.0)
Monocytes Relative: 6 %
Neutro Abs: 3.4 10*3/uL (ref 1.7–7.7)
Neutrophils Relative %: 60 %
Platelets: 113 10*3/uL — ABNORMAL LOW (ref 150–400)
RBC: 2.22 MIL/uL — ABNORMAL LOW (ref 4.22–5.81)
RDW: 12.4 % (ref 11.5–15.5)
WBC: 5.7 10*3/uL (ref 4.0–10.5)

## 2016-03-08 LAB — COMPREHENSIVE METABOLIC PANEL
ALT: 20 U/L (ref 17–63)
AST: 21 U/L (ref 15–41)
Albumin: 2.8 g/dL — ABNORMAL LOW (ref 3.5–5.0)
Alkaline Phosphatase: 62 U/L (ref 38–126)
Anion gap: 3 — ABNORMAL LOW (ref 5–15)
BUN: 11 mg/dL (ref 6–20)
CO2: 25 mmol/L (ref 22–32)
Calcium: 7.9 mg/dL — ABNORMAL LOW (ref 8.9–10.3)
Chloride: 111 mmol/L (ref 101–111)
Creatinine, Ser: 0.72 mg/dL (ref 0.61–1.24)
GFR calc Af Amer: >60 mL/min (ref >60)
GFR calc non Af Amer: >60 mL/min (ref >60)
Glucose, Bld: 122 mg/dL — ABNORMAL HIGH (ref 65–99)
Potassium: 4 mmol/L (ref 3.5–5.1)
Sodium: 139 mmol/L (ref 135–145)
Total Bilirubin: 0.6 mg/dL (ref 0.3–1.2)
Total Protein: 4.7 g/dL — ABNORMAL LOW (ref 6.5–8.1)

## 2016-03-08 LAB — H PYLORI, IGM, IGG, IGA AB
H Pylori IgG: <0.8 Index Value (ref 0.00–0.79)
H. Pylogi, Iga Abs: <9 units (ref 0.0–8.9)
H. Pylogi, Igm Abs: <9 units (ref 0.0–8.9)

## 2016-03-08 LAB — PHOSPHORUS: Phosphorus: 3.4 mg/dL (ref 2.5–4.6)

## 2016-03-08 LAB — PREPARE RBC (CROSSMATCH)

## 2016-03-08 LAB — MAGNESIUM: Magnesium: 1.5 mg/dL — ABNORMAL LOW (ref 1.7–2.4)

## 2016-03-08 LAB — HEMOGLOBIN AND HEMATOCRIT, BLOOD
HCT: 21.4 % — ABNORMAL LOW (ref 39.0–52.0)
Hemoglobin: 7.6 g/dL — ABNORMAL LOW (ref 13.0–17.0)

## 2016-03-08 MED ORDER — BUTALBITAL-APAP-CAFFEINE 50-325-40 MG PO TABS
1.0000 | ORAL_TABLET | Freq: Four times a day (QID) | ORAL | Status: DC | PRN
  Administered 2016-03-08 – 2016-03-09 (×3): 1 via ORAL
  Filled 2016-03-08 (×3): qty 1

## 2016-03-08 MED ORDER — SODIUM CHLORIDE 0.9 % IV SOLN: Freq: Once | INTRAVENOUS | Status: DC

## 2016-03-08 MED ORDER — FUROSEMIDE 10 MG/ML IJ SOLN
20.0000 mg | Freq: Once | INTRAMUSCULAR | Status: AC
  Administered 2016-03-08: 20 mg via INTRAVENOUS
  Filled 2016-03-08: qty 2

## 2016-03-08 MED ORDER — SODIUM CHLORIDE 0.9 % IV SOLN
Freq: Once | INTRAVENOUS | Status: AC
  Administered 2016-03-08: 10:00:00 via INTRAVENOUS

## 2016-03-08 MED ORDER — IOPAMIDOL (ISOVUE-370) INJECTION 76%
INTRAVENOUS | Status: AC
  Administered 2016-03-08: 100 mL
  Filled 2016-03-08: qty 100

## 2016-03-08 NOTE — Consult Note (Signed)
Reason for Consult:  GI bleed Referring Physician: Jahi, Aaron Wilkerson is an 46 y.o. male.  HPI: Pt admitted on 03/03/16 to ED with syncope and rectal bleeding.  Found in a pool of blood, pale and diaphoretic.  Pt remembered going to [facility] for a bowel movement and blood coming out of him. He had an orthostatic/syncopal episode at that time, and transported to the ED.   He was admitted by Medicine for rectal bleeding and GI consult was obtained.  He was stabilized and then underwent colonoscopy, and EGD on 03/04/16.  EGD shows: Esophagogastric landmarks identified. - Normal esophagus otherwise - Erosive gastropathy. - Erythematous duodenopathy. Colonoscopy showed:  The perianal and digital rectal examinations were normal. Maroon colored blood was found in the entire colon. The prep was only fair and significant time was spent lavaging the colon to obtain adequate views. A single medium-mouthed diverticulum was found in the distal ascending colon / hepatic flexure. When pleated back with forceps the base of it was ulcerated and very friable. For hemostasis, two hemostatic clips were successfully placed. Area adjacent to the diverticulum was tattooed with an injection of Spot. (carbon black).  Terminal ileum was normal.    Admission H/H was 13.1/38.3 03/03/16 On 03/05/16  It was down to 9.4-10.5/27-29 range On 03/06/17 9.6/28  He had more bleeding last Pm and this AM  The H/H was down  to 6.9/19.9.  No further bleeding today.  He is being transfused.  IR was contacted and CT angiogram shows:  No acute finding, with no evidence of gastrointestinal hemorrhage. Surgical clips in the ascending colon, compatible with the given history of colonoscopy and clipping.  Nonspecific trace free fluid within the abdomen adjacent to the descending colon and sigmoid colon. In the absence of trauma where this could represent mesenteric or occult bowel injury, this is most likely reactive/inflammatory.  He take BC  powders a couple times a week and has a remote hx of ETOH use, now down to a couple beers per week since 10/2015.  He reports hx of "shakes," after he quit drinking and was seen in rehab.  He is off almost everything now except for some Klonopin.   We are ask to see.    Past Medical History:  Diagnosis Date  . Alcohol use disorder, mild, in sustained remission 03/08/2016   He quit after 20 years - 12 beers per day.  Was seen and treated in Rehabilitation facility. Quit 10/2015    Past Surgical History:  Procedure Laterality Date  . COLONOSCOPY N/A 03/04/2016   Procedure: COLONOSCOPY;  Surgeon: Manus Gunning, MD;  Location: [facility];  Service: Gastroenterology;  Laterality: N/A;  . ESOPHAGOGASTRODUODENOSCOPY N/A 03/04/2016   Procedure: ESOPHAGOGASTRODUODENOSCOPY (EGD);  Surgeon: Manus Gunning, MD;  Location: [facility];  Service: Gastroenterology;  Laterality: N/A;  . KNEE SURGERY  2000    Family History  Problem Relation Age of Onset  . Hypertension Mother   . Hypertension Father   . Hyperlipidemia Father   . Heart disease Maternal Grandmother   . Heart disease Maternal Grandfather     Social History:  reports that he has never smoked. His smokeless tobacco use includes Chew. He reports that he drinks about 100.8 oz of alcohol per week . He reports that he does not use drugs.  Allergies: No Known Allergies  Medications:  Prior to Admission:  Prescriptions Prior to Admission  Medication Sig Dispense Refill Last Dose  . Aspirin-Salicylamide-Caffeine (BC FAST PAIN  RELIEF) 650-195-33.3 MG PACK Take 1 packet by mouth daily as needed (for pain).   Past Week at Unknown time  . clonazePAM (KLONOPIN) 0.5 MG tablet Take 0.5 mg by mouth 2 (two) times daily as needed for anxiety.   03/03/2016 at Unknown time  . Thiamine HCl (VITAMIN B-1 PO) Not taking  Take 1 tablet by mouth daily.   Past Month at Unknown time  . FLUoxetine (PROZAC) 20 MG tablet  Not taking Take 1 tablet  (20 mg total) by mouth daily. (Patient not taking: Reported on 03/03/2016) 30 tablet 11 Not Taking at Unknown time  . LORazepam (ATIVAN) 1 MG tablet  Not taking Take 1 tablet (1 mg total) by mouth 3 (three) times daily as needed for anxiety. (Patient not taking: Reported on 03/03/2016) 20 tablet 0 Not Taking at Unknown time  . promethazine (PHENERGAN) 25 MG tablet  Not taking Take 1 tablet (25 mg total) by mouth every 6 (six) hours as needed for nausea or vomiting. (Patient not taking: Reported on 03/03/2016) 15 tablet 0 Not Taking at Unknown time   Scheduled: . sodium chloride   Intravenous Once  . pantoprazole  40 mg Oral BID  . sodium chloride flush  3 mL Intravenous Q12H  . thiamine  100 mg Oral Daily   Continuous: . sodium chloride Stopped (03/08/16 1040)   ZOX:[id number] **OR** acetaminophen, butalbital-acetaminophen-caffeine, clonazePAM, ondansetron **OR** ondansetron (ZOFRAN) IV, traMADol  Results for orders placed or performed during the hospital encounter of 03/03/16 (from the past 48 hour(s))  CBC with Differential/Platelet     Status: Abnormal   Collection Time: 03/07/16  5:35 AM  Result Value Ref Range   WBC 4.7 4.0 - 10.5 K/uL   RBC 3.05 (L) 4.22 - 5.81 MIL/uL   Hemoglobin 9.6 (L) 13.0 - 17.0 g/dL   HCT 28.0 (L) 39.0 - 52.0 %   MCV 91.8 78.0 - 100.0 fL   MCH 31.5 26.0 - 34.0 pg   MCHC 34.3 30.0 - 36.0 g/dL   RDW 12.5 11.5 - 15.5 %   Platelets 122 (L) 150 - 400 K/uL   Neutrophils Relative % 54 %   Neutro Abs 2.5 1.7 - 7.7 K/uL   Lymphocytes Relative 33 %   Lymphs Abs 1.6 0.7 - 4.0 K/uL   Monocytes Relative 9 %   Monocytes Absolute 0.4 0.1 - 1.0 K/uL   Eosinophils Relative 3 %   Eosinophils Absolute 0.1 0.0 - 0.7 K/uL   Basophils Relative 1 %   Basophils Absolute 0.0 0.0 - 0.1 K/uL  Comprehensive metabolic panel     Status: Abnormal   Collection Time: 03/07/16  5:35 AM  Result Value Ref Range   Sodium 139 135 - 145 mmol/L   Potassium 4.2 3.5 - 5.1 mmol/L    Chloride 107 101 - 111 mmol/L   CO2 27 22 - 32 mmol/L   Glucose, Bld 123 (H) 65 - 99 mg/dL   BUN 9 6 - 20 mg/dL   Creatinine, Ser 0.75 0.61 - 1.24 mg/dL   Calcium 8.8 (L) 8.9 - 10.3 mg/dL   Total Protein 6.0 (L) 6.5 - 8.1 g/dL   Albumin 3.5 3.5 - 5.0 g/dL   AST 26 15 - 41 U/L   ALT 24 17 - 63 U/L   Alkaline Phosphatase 75 38 - 126 U/L   Total Bilirubin 0.6 0.3 - 1.2 mg/dL   GFR calc non Af Amer >60 >60 mL/min   GFR calc Af Amer >60 >60 mL/min  Comment: (NOTE) The eGFR has been calculated using the CKD EPI equation. This calculation has not been validated in all clinical situations. eGFR's persistently <60 mL/min signify possible Chronic Kidney Disease.    Anion gap 5 5 - 15  Magnesium     Status: None   Collection Time: 03/07/16  5:35 AM  Result Value Ref Range   Magnesium 1.8 1.7 - 2.4 mg/dL  Phosphorus     Status: None   Collection Time: 03/07/16  5:35 AM  Result Value Ref Range   Phosphorus 4.3 2.5 - 4.6 mg/dL  Hemoglobin and hematocrit, blood     Status: Abnormal   Collection Time: 03/07/16 11:59 PM  Result Value Ref Range   Hemoglobin 7.6 (L) 13.0 - 17.0 g/dL    Comment: DELTA CHECK NOTED REPEATED TO VERIFY    HCT 21.4 (L) 39.0 - 52.0 %  CBC with Differential/Platelet     Status: Abnormal   Collection Time: 03/08/16  5:15 AM  Result Value Ref Range   WBC 5.7 4.0 - 10.5 K/uL   RBC 2.22 (L) 4.22 - 5.81 MIL/uL   Hemoglobin 6.9 (LL) 13.0 - 17.0 g/dL    Comment: REPEATED TO VERIFY CRITICAL RESULT CALLED TO, READ BACK BY AND VERIFIED WITH: LAMB,S. RN '@[id number]'  ON 1.22.18 BY NMCCOY    HCT 19.9 (L) 39.0 - 52.0 %   MCV 89.6 78.0 - 100.0 fL   MCH 31.1 26.0 - 34.0 pg   MCHC 34.7 30.0 - 36.0 g/dL   RDW 12.4 11.5 - 15.5 %   Platelets 113 (L) 150 - 400 K/uL    Comment: REPEATED TO VERIFY SPECIMEN CHECKED FOR CLOTS PLATELET COUNT CONFIRMED BY SMEAR    Neutrophils Relative % 60 %   Lymphocytes Relative 33 %   Monocytes Relative 6 %   Eosinophils Relative 1 %    Basophils Relative 0 %   Neutro Abs 3.4 1.7 - 7.7 K/uL   Lymphs Abs 1.9 0.7 - 4.0 K/uL   Monocytes Absolute 0.3 0.1 - 1.0 K/uL   Eosinophils Absolute 0.1 0.0 - 0.7 K/uL   Basophils Absolute 0.0 0.0 - 0.1 K/uL   RBC Morphology POLYCHROMASIA PRESENT     Comment: BASOPHILIC STIPPLING  Comprehensive metabolic panel     Status: Abnormal   Collection Time: 03/08/16  5:15 AM  Result Value Ref Range   Sodium 139 135 - 145 mmol/L   Potassium 4.0 3.5 - 5.1 mmol/L   Chloride 111 101 - 111 mmol/L   CO2 25 22 - 32 mmol/L   Glucose, Bld 122 (H) 65 - 99 mg/dL   BUN 11 6 - 20 mg/dL   Creatinine, Ser 0.72 0.61 - 1.24 mg/dL   Calcium 7.9 (L) 8.9 - 10.3 mg/dL   Total Protein 4.7 (L) 6.5 - 8.1 g/dL   Albumin 2.8 (L) 3.5 - 5.0 g/dL   AST 21 15 - 41 U/L   ALT 20 17 - 63 U/L   Alkaline Phosphatase 62 38 - 126 U/L   Total Bilirubin 0.6 0.3 - 1.2 mg/dL   GFR calc non Af Amer >60 >60 mL/min   GFR calc Af Amer >60 >60 mL/min    Comment: (NOTE) The eGFR has been calculated using the CKD EPI equation. This calculation has not been validated in all clinical situations. eGFR's persistently <60 mL/min signify possible Chronic Kidney Disease.    Anion gap 3 (L) 5 - 15  Magnesium     Status: Abnormal   Collection Time:  03/08/16  5:15 AM  Result Value Ref Range   Magnesium 1.5 (L) 1.7 - 2.4 mg/dL  Phosphorus     Status: None   Collection Time: 03/08/16  5:15 AM  Result Value Ref Range   Phosphorus 3.4 2.5 - 4.6 mg/dL  Type and screen [place]     Status: None (Preliminary result)   Collection Time: 03/08/16  7:49 AM  Result Value Ref Range   ISSUE DATE / TIME [id number]    Blood Product Unit Number [id number]    PRODUCT CODE [id number]    Unit Type and Rh 5100    Blood Product Expiration Date [id number]    ISSUE DATE / TIME [id number]    Blood Product Unit Number [id number]    PRODUCT CODE [id number]    Unit Type and Rh 5100    Blood Product Expiration Date  [id number]   Prepare RBC     Status: None   Collection Time: 03/08/16  7:49 AM  Result Value Ref Range   Order Confirmation ORDER PROCESSED BY BLOOD BANK   Prepare RBC     Status: None   Collection Time: 03/08/16  8:08 AM  Result Value Ref Range   Order Confirmation ORDER PROCESSED BY BLOOD BANK     Ct Head Wo Contrast  Result Date: 03/07/2016 CLINICAL DATA:  Initial evaluation for syncope, fall, continued dizziness. EXAM: CT HEAD WITHOUT CONTRAST TECHNIQUE: Contiguous axial images were obtained from the base of the skull through the vertex without intravenous contrast. COMPARISON:  Prior CT from 01/29/2009. FINDINGS: Brain: Cerebral volume within normal limits for age. No acute intracranial hemorrhage. No evidence for acute large vessel territory infarct. No mass lesion, midline shift, or mass effect. No hydrocephalus. No extra-axial fluid collection. Vascular: No hyperdense vessel. Skull: Scalp soft tissues within normal limits.  Calvarium intact. Sinuses/Orbits: Globes and orbits within normal limits. Paranasal sinuses and mastoids are clear. IMPRESSION: Negative head CT.  No acute intracranial process identified. Electronically Signed   By: Jeannine Boga M.D.   On: 03/07/2016 19:50   Ct Angio Abd/pel W/ And/or W/o  Result Date: 03/08/2016 CLINICAL DATA:  46 year old male with a history of recurrent hematochezia. Colonoscopy demonstrates diverticula, with clipping performed EXAM: CTA ABDOMEN AND PELVIS WITH CONTRAST TECHNIQUE: Multidetector CT imaging of the abdomen and pelvis was performed using the standard protocol during bolus administration of intravenous contrast. Multiplanar reconstructed images and MIPs were obtained and reviewed to evaluate the vascular anatomy. CONTRAST:  100 cc Isovue 370 COMPARISON:  None. FINDINGS: VASCULAR Aorta: Unremarkable course, caliber, contour of the abdominal aorta. No dissection, aneurysm, or periaortic fluid. Celiac: No significant atherosclerotic  changes at the origin of the celiac artery. Typical branch pattern of the celiac artery, with left gastric, common hepatic, and splenic artery identified. SMA: No significant atherosclerotic changes of the superior mesenteric artery. Renals: Renal arteries are patent. Single left renal artery. Two right renal artery. IMA: Inferior mesenteric artery is patent. Left colic artery patent. Superior rectal artery patent. Right lower extremity: Unremarkable course, caliber, and contour of the right iliac system. No aneurysm, dissection, or occlusion. Hypogastric artery is patent. Anterior and posterior division patent. Common femoral artery patent. Proximal SFA and profunda femoris patent. Left lower extremity: Unremarkable course, caliber, and contour of the left iliac system. No aneurysm, dissection, or occlusion. Hypogastric artery is patent. Anterior and posterior division patent. Common femoral artery patent. Proximal SFA and profunda femoris patent. Veins: Unremarkable appearance of the venous system. Review of the  MIP images confirms the above findings. NON-VASCULAR Lower chest: Unremarkable. Hepatobiliary: Unremarkable appearance of the liver. Unremarkable gall bladder. Pancreas: Unremarkable appearance of the pancreas. No pericholecystic fluid or inflammatory changes. Unremarkable ductal system. Spleen: Unremarkable. Adrenals/Urinary Tract: Unremarkable appearance of adrenal glands. Right: No hydronephrosis. Symmetric perfusion to the left. No nephrolithiasis. Unremarkable course of the right ureter. Left: No hydronephrosis. Symmetric perfusion to the right. No nephrolithiasis. Unremarkable course of the left ureter. Distention of the urinary bladder. Stomach/Bowel: Unremarkable appearance of the stomach. Unremarkable appearance of small bowel. No evidence of obstruction. Normal appendix. Colonic diverticula. There are adjacent surgical clips within the ascending colon compatible with given history. No evidence of  pooling contrast or a extravasation of contrast to suggest hemorrhage. Lymphatic: There are small lymph nodes within the para-aortic/preaortic nodal station along the aorta. There are small lymph nodes within the base of the mesenteric. Portacaval node measures 12 mm-13 mm. Node adjacent to pancreatic head measures 17 mm. Mesenteric: Trace free fluid within the lower abdomen adjacent to the descending colon and within the mid abdomen adjacent to the sigmoid. Reproductive: Unremarkable appearance of the pelvic organs. Other: No hernia. Musculoskeletal: No evidence of acute fracture. No bony canal narrowing. No significant degenerative changes of the hips. IMPRESSION: No acute finding, with no evidence of gastrointestinal hemorrhage. Surgical clips in the ascending colon, compatible with the given history of colonoscopy and clipping. Nonspecific trace free fluid within the abdomen adjacent to the descending colon and sigmoid colon. In the absence of trauma where this could represent mesenteric or occult bowel injury, this is most likely reactive/inflammatory. Nonspecific lymph nodes in the preaortic/periaortic nodal stations, base of the mesentery, and within the upper abdomen. Again, this may represent inflammatory/reactive changes, however, if there is concern for lymphoproliferative disorder, recommend correlation with lab values. Signed, Dulcy Fanny. Earleen Newport, DO [facility] Electronically Signed   By: Corrie Mckusick D.O.   On: 03/08/2016 14:29    Review of Systems  Constitutional: Negative.   HENT: Negative.   Eyes: Negative.   Respiratory: Negative for cough, hemoptysis, sputum production, shortness of breath and wheezing.        Wife says he has sleep apnea and snores.   Cardiovascular: Positive for chest pain.  Gastrointestinal: Positive for blood in stool, heartburn, melena and nausea. Negative for abdominal pain, constipation, diarrhea and  vomiting.  Genitourinary: Positive for frequency (with IV fluids). Negative for dysuria, flank pain, hematuria and urgency.       Trouble starting stream since he arrived in hospital  Musculoskeletal: Negative.   Skin: Negative.   Neurological: Negative.   Endo/Heme/Allergies: Negative.   Psychiatric/Behavioral: Negative.    Blood pressure 126/83, pulse 73, temperature 98.2 F (36.8 C), temperature source Oral, resp. rate 16, height '5\' 10"'  (1.778 m), weight 89.4 kg (197 lb), SpO2 100 %. Physical Exam  Assessment/Plan: Lower GI bleed with single bleeding diverticulum found and clipped in the Ascending colon Recurrent GI bleed with negative CT angiogram today Complaints of belching/GERD - since he quit drinking Headaches - BC powders at home about 2 times per week Sleep apnea/snoring - per wife Hx of ETOH use - quit 10/2015    Plan:  Agree with transfusing and will follow with you.  If he re-bleeds again, I would discuss with GI possible repeat: Colonoscopy/bleeding scan/repeat CT angiogram to look for source.   No NSAIDS.  Clear liquids for now and observe.   Adolfo Granieri 03/08/2016, 5:31 PM

## 2016-03-08 NOTE — Progress Notes (Signed)
PROGRESS NOTE   VALOR QUAINTANCE  NFA:[id number] DOB: [birth year] DOA: 03/03/2016 PCP: Kristian Covey, MD   Brief Narrative:  Aaron Wilkerson is a 46 y.o. male with no significant medical history, who presents to the hospital chief complaint of rectal bleeding. Approximately 10 AM yesterday morning he noticed significant bright red blood per rectum, it has been persistent, with no improving or worsening factors, it has been associated with nausea, mild lower abdominal pain, dizziness and orthostatic symptoms. Patient suffered syncope while going from his bedroom to the bathroom, experiencing head trauma. So far patient had at least 3 large bloody bowel movements. Due to his persistent symptoms he came to the hospital for further evaluation. Was worked up and admitted for Lower GI bleed and was evaluated by Gastroenterology and underwent Upper Endoscopy and Colonoscopy yesterday. Has not had a bowel movement since colonoscopy; Diet advanced to regular diet by Gastroenterology. Patient was going to be D/C'd n 03/06/16 however became suddenly dizzy and nauseous upon standing so discharge was canceled and patient was bolused 1 liter and held to re-evaluate in AM and repeat Orthostatics. On 03/07/16 AM patient remained dizzy and had a headache so a CT of the Head was ordered and he was bolused another liter of fluid. Maintence fluid was increased to 125 mL/hr and hour and TED hose were provided. Overnight he had a Syncopal Episode and had a large bloody bowel movement and dropped his Hb to 6.9. He is going to be transfused 2 units of pRBC's and undergo CTA. GI was re-consulted and IR was notified. CTA done and showed no GI hemorrhage but General Surgery consulted incase patient clinically deteriorates.   Assessment & Plan:   Principal Problem:   GI bleeding Active Problems:   Rectal bleeding   Acute blood loss anemia   Erosive gastropathy   Syncope   Diverticulosis of colon with hemorrhage  Acute Lower  GI bleed Likely 2/2 to Diverticulosis -Patient's Hb/Hct went from 13.1/38.3 -> 6.9/19.9 as he had large bloody bowel movement last night -Appreciate Gastroenterology Evaluation and recc's -FOBT Positive -Patent is s/p Colonoscopy: Showed Blood in the entire examined colon and diverticulosis in the distal ascending colon was noted and was suspected to be the site of bleeding. Clips were place and it was tattooed with Carbon Black. The examined portion of the ileum was normal and patient was found to have non-bleeding internal hemorrhiods. Otherwise examination was normal and bowel prep was inadequate for screening purposes and repeat colonoscopy was recommended at age 76 -Patient is also s/p Endoscopy to R/o Upper GI Bleed: Showed Esophagogastric landmarks identified, with normal esophagus, erosive gastropathy, and Erythematous duodenopathy and suspected changes in the stomach and duodenum due to NSAID use. Will r/o H. Pylori. -CT-Angio ordered to evaluate as patient had reccurrent bleed with significant drop in Hb and Syncopal episode; Did not show active bleeding; May need Tagged RBC scan -Interventional Radiology notified and discussed with Interventional Radiology Dr. Deanne Coffer; no acute finding was seen with no evidence of Gastrointestinal Hemorrhage; If continues to deteriorate will get tagged RBC Scan; -General Surgery notified as back up and for additional Reccs -Increased NS at 125 mL/hr again as patient became dizzy  -C/w Clear Liquid Diet -C/w po Protonix 40 mg Daily -Repeat CBC in AM  Acute Blood Loss Anemia s/p Transfusion of 2 units of pRBC -Patient's Hb/Hct went from 13.1/38.3 ->  6.9/19.9 -Ordered 2 units of pRBC's and being transfused; -CTA to Evaluate and did not show evidence of  gastrointestinal hemorrhage -As Above -Repeat CBC in AM  Erosive Gastropathy and Erythematous Duodenopathy -EGD Findings as above -C/w po Protonix -H Pylori Serology ordered and pending    Syncope -Head CT w/o Contrast Negative and showed no acute intracranial process identified -Likely from Acute Blood Loss/Orthostasis/Vasovagal; Currently being transfused 2 units of pRBC's -Repeat Orthostatics  -C/w Maintenance Fluid to 125 mL/hr  -Ordered TED Hose -Continue to Monitor  Headache -Continue with Acetaminophen and Tramadol -Will try Fiorcet; Avoid NSAIDs in the setting of GI Bleed   Anxiety -Continue Clonazepam 0.5 mg po BIDprn  DVT prophylaxis: SCDs Code Status: FULL CODE Family Communication: Wife and parents are at bedside Disposition Plan: Home Likely when stable for D/C pending workup  Consultants:   Gastroenterology  Interventional Radiology  General Surgery   Procedures: Upper Endoscopy and Colonoscopy; CT Abdomen   Antimicrobials: None  Subjective: Seen and examined and had an episode of weakness yesterday and almost passed out. Had large BRBPR yesterday night and continued to feel weak. No N/V but complained of a headache still.   Objective: Vitals:   03/08/16 1105 03/08/16 1155 03/08/16 1353 03/08/16 1449  BP: 135/83 137/89 134/84 132/89  Pulse: 85 81 86 69  Resp: 18 16 18 16   Temp: 98.4 F (36.9 C) 98.5 F (36.9 C) 97.7 F (36.5 C) 98.4 F (36.9 C)  TempSrc: Oral Oral Axillary Oral  SpO2: 100% 100% 100% 100%  Weight:      Height:        Intake/Output Summary (Last 24 hours) at 03/08/16 1452 Last data filed at 03/08/16 1400  Gross per 24 hour  Intake             1942 ml  Output              800 ml  Net             1142 ml   Filed Weights   03/03/16 1358 03/03/16 2052 03/04/16 0848  Weight: 90.7 kg (200 lb) 89.4 kg (197 lb 1.5 oz) 89.4 kg (197 lb)   Examination: Physical Exam:  Constitutional: WN/WD, NAD and appears weaker Eyes: Lids normal however there is significant conjunctival pallor sclerae anicteric  ENMT: External Ears, Nose appear normal. Grossly normal hearing. Neck: Appears normal, supple, no cervical masses,  normal ROM, no appreciable thyromegaly, no JVD Respiratory: Clear to auscultation bilaterally, no wheezing, rales, rhonchi or crackles. Normal respiratory effort and patient is not tachypenic. No accessory muscle use.  Cardiovascular: RRR, no murmurs / rubs / gallops. S1 and S2 auscultated. No extremity edema.  Abdomen: Soft, non-tender, mildly disenteded. No masses palpated. No appreciable hepatosplenomegaly. Bowel sounds positive x4.  GU: Deferred. Musculoskeletal: No clubbing / cyanosis of digits/nails. No joint deformity upper and lower extremities.  Skin: No rashes, lesions, ulcers. No induration; Warm and dry.  Neurologic: CN 2-12 grossly intact with no focal deficits. Romberg sign cerebellar reflexes not assessed.  Psychiatric: Normal judgment and insight. Alert and oriented x 3. Normal mood and appropriate affect.   Data Reviewed: I have personally reviewed following labs and imaging studies  CBC:  Recent Labs Lab 03/03/16 1416  03/04/16 0244  03/05/16 0506  03/06/16 0530 03/06/16 1219 03/07/16 0535 03/07/16 2359 03/08/16 0515  WBC 12.6*  --  8.2  --  4.4  --  4.7  --  4.7  --  5.7  NEUTROABS 10.3*  --   --   --  2.2  --  2.5  --  2.5  --  3.4  HGB 13.1  < > 10.8*  < > 9.4*  < > 9.6* 9.6* 9.6* 7.6* 6.9*  HCT 38.3*  < > 31.3*  < > 27.5*  < > 27.7* 27.4* 28.0* 21.4* 19.9*  MCV 91.6  --  91.5  --  90.8  --  91.7  --  91.8  --  89.6  PLT 158  --  128*  --  108*  --  102*  --  122*  --  113*  < > = values in this interval not displayed. Basic Metabolic Panel:  Recent Labs Lab 03/04/16 0244 03/05/16 0506 03/06/16 0530 03/07/16 0535 03/08/16 0515  NA 139 139 139 139 139  K 4.2 3.9 3.9 4.2 4.0  CL 109 107 107 107 111  CO2 24 27 27 27 25   GLUCOSE 138* 123* 118* 123* 122*  BUN 19 8 8 9 11   CREATININE 0.70 0.67 0.75 0.75 0.72  CALCIUM 8.5* 8.4* 8.5* 8.8* 7.9*  MG  --  1.7 1.8 1.8 1.5*  PHOS  --  3.4 4.1 4.3 3.4   GFR: Estimated Creatinine Clearance: 129.9 mL/min (by  C-G formula based on SCr of 0.72 mg/dL). Liver Function Tests:  Recent Labs Lab 03/04/16 0244 03/05/16 0506 03/06/16 0530 03/07/16 0535 03/08/16 0515  AST 24 26 27 26 21   ALT 22 22 24 24 20   ALKPHOS 73 59 76 75 62  BILITOT 0.9 1.0 0.7 0.6 0.6  PROT 6.1* 5.6* 5.7* 6.0* 4.7*  ALBUMIN 3.6 3.3* 3.6 3.5 2.8*   No results for input(s): LIPASE, AMYLASE in the last 168 hours. No results for input(s): AMMONIA in the last 168 hours. Coagulation Profile:  Recent Labs Lab 03/03/16 1416  INR 1.17   Cardiac Enzymes: No results for input(s): CKTOTAL, CKMB, CKMBINDEX, TROPONINI in the last 168 hours. BNP (last 3 results) No results for input(s): PROBNP in the last 8760 hours. HbA1C: No results for input(s): HGBA1C in the last 72 hours. CBG:  Recent Labs Lab 03/03/16 1426  GLUCAP 106*   Lipid Profile: No results for input(s): CHOL, HDL, LDLCALC, TRIG, CHOLHDL, LDLDIRECT in the last 72 hours. Thyroid Function Tests: No results for input(s): TSH, T4TOTAL, FREET4, T3FREE, THYROIDAB in the last 72 hours. Anemia Panel: No results for input(s): VITAMINB12, FOLATE, FERRITIN, TIBC, IRON, RETICCTPCT in the last 72 hours. Sepsis Labs: No results for input(s): PROCALCITON, LATICACIDVEN in the last 168 hours.  No results found for this or any previous visit (from the past 240 hour(s)).   Radiology Studies: Ct Head Wo Contrast  Result Date: 03/07/2016 CLINICAL DATA:  Initial evaluation for syncope, fall, continued dizziness. EXAM: CT HEAD WITHOUT CONTRAST TECHNIQUE: Contiguous axial images were obtained from the base of the skull through the vertex without intravenous contrast. COMPARISON:  Prior CT from 01/29/2009. FINDINGS: Brain: Cerebral volume within normal limits for age. No acute intracranial hemorrhage. No evidence for acute large vessel territory infarct. No mass lesion, midline shift, or mass effect. No hydrocephalus. No extra-axial fluid collection. Vascular: No hyperdense vessel.  Skull: Scalp soft tissues within normal limits.  Calvarium intact. Sinuses/Orbits: Globes and orbits within normal limits. Paranasal sinuses and mastoids are clear. IMPRESSION: Negative head CT.  No acute intracranial process identified. Electronically Signed   By: Rise Mu M.D.   On: 03/07/2016 19:50   Ct Angio Abd/pel W/ And/or W/o  Result Date: 03/08/2016 CLINICAL DATA:  46 year old male with a history of recurrent hematochezia. Colonoscopy demonstrates diverticula, with clipping performed EXAM: CTA ABDOMEN  AND PELVIS WITH CONTRAST TECHNIQUE: Multidetector CT imaging of the abdomen and pelvis was performed using the standard protocol during bolus administration of intravenous contrast. Multiplanar reconstructed images and MIPs were obtained and reviewed to evaluate the vascular anatomy. CONTRAST:  100 cc Isovue 370 COMPARISON:  None. FINDINGS: VASCULAR Aorta: Unremarkable course, caliber, contour of the abdominal aorta. No dissection, aneurysm, or periaortic fluid. Celiac: No significant atherosclerotic changes at the origin of the celiac artery. Typical branch pattern of the celiac artery, with left gastric, common hepatic, and splenic artery identified. SMA: No significant atherosclerotic changes of the superior mesenteric artery. Renals: Renal arteries are patent. Single left renal artery. Two right renal artery. IMA: Inferior mesenteric artery is patent. Left colic artery patent. Superior rectal artery patent. Right lower extremity: Unremarkable course, caliber, and contour of the right iliac system. No aneurysm, dissection, or occlusion. Hypogastric artery is patent. Anterior and posterior division patent. Common femoral artery patent. Proximal SFA and profunda femoris patent. Left lower extremity: Unremarkable course, caliber, and contour of the left iliac system. No aneurysm, dissection, or occlusion. Hypogastric artery is patent. Anterior and posterior division patent. Common femoral  artery patent. Proximal SFA and profunda femoris patent. Veins: Unremarkable appearance of the venous system. Review of the MIP images confirms the above findings. NON-VASCULAR Lower chest: Unremarkable. Hepatobiliary: Unremarkable appearance of the liver. Unremarkable gall bladder. Pancreas: Unremarkable appearance of the pancreas. No pericholecystic fluid or inflammatory changes. Unremarkable ductal system. Spleen: Unremarkable. Adrenals/Urinary Tract: Unremarkable appearance of adrenal glands. Right: No hydronephrosis. Symmetric perfusion to the left. No nephrolithiasis. Unremarkable course of the right ureter. Left: No hydronephrosis. Symmetric perfusion to the right. No nephrolithiasis. Unremarkable course of the left ureter. Distention of the urinary bladder. Stomach/Bowel: Unremarkable appearance of the stomach. Unremarkable appearance of small bowel. No evidence of obstruction. Normal appendix. Colonic diverticula. There are adjacent surgical clips within the ascending colon compatible with given history. No evidence of pooling contrast or a extravasation of contrast to suggest hemorrhage. Lymphatic: There are small lymph nodes within the para-aortic/preaortic nodal station along the aorta. There are small lymph nodes within the base of the mesenteric. Portacaval node measures 12 mm-13 mm. Node adjacent to pancreatic head measures 17 mm. Mesenteric: Trace free fluid within the lower abdomen adjacent to the descending colon and within the mid abdomen adjacent to the sigmoid. Reproductive: Unremarkable appearance of the pelvic organs. Other: No hernia. Musculoskeletal: No evidence of acute fracture. No bony canal narrowing. No significant degenerative changes of the hips. IMPRESSION: No acute finding, with no evidence of gastrointestinal hemorrhage. Surgical clips in the ascending colon, compatible with the given history of colonoscopy and clipping. Nonspecific trace free fluid within the abdomen adjacent to  the descending colon and sigmoid colon. In the absence of trauma where this could represent mesenteric or occult bowel injury, this is most likely reactive/inflammatory. Nonspecific lymph nodes in the preaortic/periaortic nodal stations, base of the mesentery, and within the upper abdomen. Again, this may represent inflammatory/reactive changes, however, if there is concern for lymphoproliferative disorder, recommend correlation with lab values. Signed, Yvone NeuJaime S. Loreta AveWagner, DO [facility] Electronically Signed   By: Gilmer MorJaime  Wagner D.O.   On: 03/08/2016 14:29     Upper GI Endoscopy                           - Esophagogastric landmarks identified.                           -  Normal esophagus otherwise                           - Erosive gastropathy.                           - Erythematous duodenopathy.                           Suspect changes in the stomach and duodenum due to                            NSAIDs, but would rule out H pylori.  Colonoscopy       - Preparation of the colon was fair.                           - Blood in the entire examined colon.                           - Diverticulosis in the distal ascending colon,                            suspect this may have been the site of bleeding.                            Clips were placed. Tattooed.                           - The examined portion of the ileum was normal.                           - Non-bleeding internal hemorrhoids.                           - The examination was otherwise normal. Bowel prep                            was inadequate for screening purposes.   Scheduled Meds: . sodium chloride   Intravenous Once  . pantoprazole  40 mg Oral BID  . sodium chloride flush  3 mL Intravenous Q12H  . thiamine  100 mg Oral Daily   Continuous Infusions: . sodium chloride Stopped (03/08/16 1040)    LOS: 5 days   Merlene Laughter, DO [facility] Pager  [phone]  If 7PM-7AM, please contact night-coverage [email or web address] Password [email or web address] 03/08/2016, 2:52 PM

## 2016-03-08 NOTE — Progress Notes (Signed)
   03/07/16 2249  What Happened  Was fall witnessed? No  Was patient injured? No  Patient found on floor  Found by Staff-comment (Lab said they heard it and found him on the floor)  Stated prior activity bathroom-unassisted  Follow Up  MD notified Schorr  Time MD notified 2250  Family notified Yes-comment (patient called wife and told her to call the hospital )  Time family notified 2240  Additional tests No  Simple treatment Other (comment) (Fluid Bolus)  Progress note created (see row info) Yes  Adult Fall Risk Assessment  Risk Factor Category (scoring not indicated) Fall has occurred during this admission (document High fall risk)  Patient's Fall Risk High Fall Risk (>13 points)  Adult Fall Risk Interventions  Required Bundle Interventions *See Row Information* High fall risk - low, moderate, and high requirements implemented  Additional Interventions Individualized elimination schedule;Use of appropriate toileting equipment (bedpan, BSC, etc.);Room near nurses station  Screening for Fall Injury Risk  Risk For Fall Injury- See Row Information  F;Nurse judgement  Injury Prevention Interventions (bed in lowest position)

## 2016-03-08 NOTE — Progress Notes (Signed)
[facility] Progress Note  Chief Complaint:   Rectal bleeding  Subjective: a little weak, no BM or bleeding this am  Objective:  Vital signs in last 24 hours: Temp:  [97.8 F (36.6 C)-98.5 F (36.9 C)] 98.5 F (36.9 C) (01/22 1155) Pulse Rate:  [74-86] 81 (01/22 1155) Resp:  [14-18] 16 (01/22 1155) BP: (85-137)/(47-90) 137/89 (01/22 1155) SpO2:  [98 %-100 %] 100 % (01/22 1155) Last BM Date: 03/07/16 General:   Alert, well-developed,white male in NAD EENT:  Normal hearing, non icteric sclera, conjunctive pink.  Heart:  Regular rate and rhythm, no lower extremity edema Pulm: Normal respiratory effort, lungs CTA bilaterally without wheezes or crackles. Abdomen:  Soft, nondistended, nontender.  Normal bowel sounds, no masses felt. No hepatomegaly.    Neurologic:  Alert and  oriented x4;  grossly normal neurologically. Psych:  Alert and cooperative. Normal mood and affect.  Intake/Output from previous day: 01/21 0701 - 01/22 0700 In: 1608.8 [P.O.:240; I.V.:1368.8] Out: -  Intake/Output this shift: No intake/output data recorded.  Lab Results:  Recent Labs  03/06/16 0530  03/07/16 0535 03/07/16 2359 03/08/16 0515  WBC 4.7  --  4.7  --  5.7  HGB 9.6*  < > 9.6* 7.6* 6.9*  HCT 27.7*  < > 28.0* 21.4* 19.9*  PLT 102*  --  122*  --  113*  < > = values in this interval not displayed. BMET  Recent Labs  03/06/16 0530 03/07/16 0535 03/08/16 0515  NA 139 139 139  K 3.9 4.2 4.0  CL 107 107 111  CO2 27 27 25   GLUCOSE 118* 123* 122*  BUN 8 9 11   CREATININE 0.75 0.75 0.72  CALCIUM 8.5* 8.8* 7.9*   LFT  Recent Labs  03/08/16 0515  PROT 4.7*  ALBUMIN 2.8*  AST 21  ALT 20  ALKPHOS 62  BILITOT 0.6    Ct Head Wo Contrast  Result Date: 03/07/2016 CLINICAL DATA:  Initial evaluation for syncope, fall, continued dizziness. EXAM: CT HEAD WITHOUT CONTRAST TECHNIQUE: Contiguous axial images were obtained from the base of the skull through the vertex  without intravenous contrast. COMPARISON:  Prior CT from 01/29/2009. FINDINGS: Brain: Cerebral volume within normal limits for age. No acute intracranial hemorrhage. No evidence for acute large vessel territory infarct. No mass lesion, midline shift, or mass effect. No hydrocephalus. No extra-axial fluid collection. Vascular: No hyperdense vessel. Skull: Scalp soft tissues within normal limits.  Calvarium intact. Sinuses/Orbits: Globes and orbits within normal limits. Paranasal sinuses and mastoids are clear. IMPRESSION: Negative head CT.  No acute intracranial process identified. Electronically Signed   By: Rise Mu M.D.   On: 03/07/2016 19:50    Assessment / Plan:  1. 46 yo male admitted with recurrent hematochezia last night. He was admitted with bleeding and on 03/04/16 EGD revealed erosive gastritis / mild duodenitis.  Colonoscopy revealed diverticulum in distal ascending colon / hepatic flexure which was ulcerated and friable.  Two hemostasis clips placed, area tatooed. No further bleeding until last night at which time patient was orthostatic and fell.  - Spoke with IR about case, Dr. Deanne Coffer. Agrees that diverticulum probably site of bleeding but recommends CTA which has already been ordered. They will await CTA results and proceed from there.   2. ABL. Hgb dropped from 9.6 on 1/20 (following initial bleed) to 7.6 last night and 6.9 this am. Blood transfusion in progress.     IPrincipal Problem:   GI bleeding Active Problems:  Rectal bleeding   Acute blood loss anemia   Erosive gastropathy   Syncope   Diverticulosis of colon with hemorrhage    LOS: 5 days   Willette ClusterPaula Guenther NP 03/08/2016, 11:57 AM Pager number [phone]  GI ATTENDING  Call back to see the patient for recurrent GI bleeding. I evaluated the patient along with the GI nurse practitioner. Wife in room. Workup from Dr. Adela LankArmbruster last week noted. Had recurrent bleeding with syncopal episode last evening and  significant drop in hemoglobin. No further bleeding this morning. Stable. Suspicious area in right colon identified and treated/marked with clips and tattoo. IR aware of case and planning CTA as noted above. If bleeding persists despite IR intervention, may need surgery. Hopefully not. Support with blood products as needed  Wilhemina BonitoJohn N. Eda KeysPerry, Jr., M.D. [facility]

## 2016-03-09 LAB — CBC WITH DIFFERENTIAL/PLATELET
Basophils Absolute: 0 10*3/uL (ref 0.0–0.1)
Basophils Relative: 0 %
Eosinophils Absolute: 0.1 10*3/uL (ref 0.0–0.7)
Eosinophils Relative: 3 %
HCT: 24.5 % — ABNORMAL LOW (ref 39.0–52.0)
Hemoglobin: 8.6 g/dL — ABNORMAL LOW (ref 13.0–17.0)
Lymphocytes Relative: 39 %
Lymphs Abs: 1.9 10*3/uL (ref 0.7–4.0)
MCH: 30.6 pg (ref 26.0–34.0)
MCHC: 35.1 g/dL (ref 30.0–36.0)
MCV: 87.2 fL (ref 78.0–100.0)
Monocytes Absolute: 0.4 10*3/uL (ref 0.1–1.0)
Monocytes Relative: 9 %
Neutro Abs: 2.5 10*3/uL (ref 1.7–7.7)
Neutrophils Relative %: 49 %
Platelets: 125 10*3/uL — ABNORMAL LOW (ref 150–400)
RBC: 2.81 MIL/uL — ABNORMAL LOW (ref 4.22–5.81)
RDW: 13.5 % (ref 11.5–15.5)
WBC: 5 10*3/uL (ref 4.0–10.5)

## 2016-03-09 LAB — TYPE AND SCREEN
Blood Product Expiration Date: 201802072359
Blood Product Expiration Date: 201802072359
ISSUE DATE / TIME: 201801221042
ISSUE DATE / TIME: 201801221453
Unit Type and Rh: 5100
Unit Type and Rh: 5100

## 2016-03-09 LAB — COMPREHENSIVE METABOLIC PANEL
ALT: 22 U/L (ref 17–63)
AST: 22 U/L (ref 15–41)
Albumin: 3.1 g/dL — ABNORMAL LOW (ref 3.5–5.0)
Alkaline Phosphatase: 52 U/L (ref 38–126)
Anion gap: 5 (ref 5–15)
BUN: 14 mg/dL (ref 6–20)
CO2: 25 mmol/L (ref 22–32)
Calcium: 8.2 mg/dL — ABNORMAL LOW (ref 8.9–10.3)
Chloride: 110 mmol/L (ref 101–111)
Creatinine, Ser: 0.67 mg/dL (ref 0.61–1.24)
GFR calc Af Amer: >60 mL/min (ref >60)
GFR calc non Af Amer: >60 mL/min (ref >60)
Glucose, Bld: 93 mg/dL (ref 65–99)
Potassium: 3.8 mmol/L (ref 3.5–5.1)
Sodium: 140 mmol/L (ref 135–145)
Total Bilirubin: 1 mg/dL (ref 0.3–1.2)
Total Protein: 5.3 g/dL — ABNORMAL LOW (ref 6.5–8.1)

## 2016-03-09 LAB — HEMOGLOBIN AND HEMATOCRIT, BLOOD
HCT: 25.6 % — ABNORMAL LOW (ref 39.0–52.0)
Hemoglobin: 9 g/dL — ABNORMAL LOW (ref 13.0–17.0)

## 2016-03-09 LAB — MAGNESIUM: Magnesium: 1.6 mg/dL — ABNORMAL LOW (ref 1.7–2.4)

## 2016-03-09 LAB — PHOSPHORUS: Phosphorus: 3.7 mg/dL (ref 2.5–4.6)

## 2016-03-09 MED ORDER — MAGNESIUM SULFATE 2 GM/50ML IV SOLN
2.0000 g | Freq: Once | INTRAVENOUS | Status: AC
  Administered 2016-03-09: 2 g via INTRAVENOUS
  Filled 2016-03-09: qty 50

## 2016-03-09 NOTE — Progress Notes (Signed)
5 Days Post-Op  Subjective: No complaints, no further bleeding.   Objective: Vital signs in last 24 hours: Temp:  [97.7 F (36.5 C)-98.5 F (36.9 C)] 98.1 F (36.7 C) (01/23 0536) Pulse Rate:  [63-86] 63 (01/23 0536) Resp:  [16-18] 16 (01/23 0536) BP: (122-152)/(78-97) 122/78 (01/23 0536) SpO2:  [99 %-100 %] 99 % (01/23 0536) Last BM Date: 03/08/16 2800 IV 2300 urine Afebrile, VSS BP up since transfusion  H/H is up other labs are stable CT angio yesterday showed no bleeding sites Intake/Output from previous day: 01/22 0701 - 01/23 0700 In: 2754.1 [I.V.:2083.3; Blood:670.8] Out: 2300 [Urine:2300] Intake/Output this shift: No intake/output data recorded.  General appearance: alert, cooperative and no distress GI: soft, non-tender; bowel sounds normal; no masses,  no organomegaly  Lab Results:   Recent Labs  03/08/16 0515 03/09/16 0514  WBC 5.7 5.0  HGB 6.9* 8.6*  HCT 19.9* 24.5*  PLT 113* 125*    BMET  Recent Labs  03/08/16 0515 03/09/16 0514  NA 139 140  K 4.0 3.8  CL 111 110  CO2 25 25  GLUCOSE 122* 93  BUN 11 14  CREATININE 0.72 0.67  CALCIUM 7.9* 8.2*   PT/INR No results for input(s): LABPROT, INR in the last 72 hours.   Recent Labs Lab 03/05/16 0506 03/06/16 0530 03/07/16 0535 03/08/16 0515 03/09/16 0514  AST 26 27 26 21 22   ALT 22 24 24 20 22   ALKPHOS 59 76 75 62 52  BILITOT 1.0 0.7 0.6 0.6 1.0  PROT 5.6* 5.7* 6.0* 4.7* 5.3*  ALBUMIN 3.3* 3.6 3.5 2.8* 3.1*     Lipase  No results found for: LIPASE   Studies/Results: Ct Head Wo Contrast  Result Date: 03/07/2016 CLINICAL DATA:  Initial evaluation for syncope, fall, continued dizziness. EXAM: CT HEAD WITHOUT CONTRAST TECHNIQUE: Contiguous axial images were obtained from the base of the skull through the vertex without intravenous contrast. COMPARISON:  Prior CT from 01/29/2009. FINDINGS: Brain: Cerebral volume within normal limits for age. No acute intracranial hemorrhage. No evidence  for acute large vessel territory infarct. No mass lesion, midline shift, or mass effect. No hydrocephalus. No extra-axial fluid collection. Vascular: No hyperdense vessel. Skull: Scalp soft tissues within normal limits.  Calvarium intact. Sinuses/Orbits: Globes and orbits within normal limits. Paranasal sinuses and mastoids are clear. IMPRESSION: Negative head CT.  No acute intracranial process identified. Electronically Signed   By: Rise MuBenjamin  McClintock M.D.   On: 03/07/2016 19:50   Ct Angio Abd/pel W/ And/or W/o  Result Date: 03/08/2016 CLINICAL DATA:  46 year old male with a history of recurrent hematochezia. Colonoscopy demonstrates diverticula, with clipping performed EXAM: CTA ABDOMEN AND PELVIS WITH CONTRAST TECHNIQUE: Multidetector CT imaging of the abdomen and pelvis was performed using the standard protocol during bolus administration of intravenous contrast. Multiplanar reconstructed images and MIPs were obtained and reviewed to evaluate the vascular anatomy. CONTRAST:  100 cc Isovue 370 COMPARISON:  None. FINDINGS: VASCULAR Aorta: Unremarkable course, caliber, contour of the abdominal aorta. No dissection, aneurysm, or periaortic fluid. Celiac: No significant atherosclerotic changes at the origin of the celiac artery. Typical branch pattern of the celiac artery, with left gastric, common hepatic, and splenic artery identified. SMA: No significant atherosclerotic changes of the superior mesenteric artery. Renals: Renal arteries are patent. Single left renal artery. Two right renal artery. IMA: Inferior mesenteric artery is patent. Left colic artery patent. Superior rectal artery patent. Right lower extremity: Unremarkable course, caliber, and contour of the right iliac system. No aneurysm,  dissection, or occlusion. Hypogastric artery is patent. Anterior and posterior division patent. Common femoral artery patent. Proximal SFA and profunda femoris patent. Left lower extremity: Unremarkable course,  caliber, and contour of the left iliac system. No aneurysm, dissection, or occlusion. Hypogastric artery is patent. Anterior and posterior division patent. Common femoral artery patent. Proximal SFA and profunda femoris patent. Veins: Unremarkable appearance of the venous system. Review of the MIP images confirms the above findings. NON-VASCULAR Lower chest: Unremarkable. Hepatobiliary: Unremarkable appearance of the liver. Unremarkable gall bladder. Pancreas: Unremarkable appearance of the pancreas. No pericholecystic fluid or inflammatory changes. Unremarkable ductal system. Spleen: Unremarkable. Adrenals/Urinary Tract: Unremarkable appearance of adrenal glands. Right: No hydronephrosis. Symmetric perfusion to the left. No nephrolithiasis. Unremarkable course of the right ureter. Left: No hydronephrosis. Symmetric perfusion to the right. No nephrolithiasis. Unremarkable course of the left ureter. Distention of the urinary bladder. Stomach/Bowel: Unremarkable appearance of the stomach. Unremarkable appearance of small bowel. No evidence of obstruction. Normal appendix. Colonic diverticula. There are adjacent surgical clips within the ascending colon compatible with given history. No evidence of pooling contrast or a extravasation of contrast to suggest hemorrhage. Lymphatic: There are small lymph nodes within the para-aortic/preaortic nodal station along the aorta. There are small lymph nodes within the base of the mesenteric. Portacaval node measures 12 mm-13 mm. Node adjacent to pancreatic head measures 17 mm. Mesenteric: Trace free fluid within the lower abdomen adjacent to the descending colon and within the mid abdomen adjacent to the sigmoid. Reproductive: Unremarkable appearance of the pelvic organs. Other: No hernia. Musculoskeletal: No evidence of acute fracture. No bony canal narrowing. No significant degenerative changes of the hips. IMPRESSION: No acute finding, with no evidence of gastrointestinal  hemorrhage. Surgical clips in the ascending colon, compatible with the given history of colonoscopy and clipping. Nonspecific trace free fluid within the abdomen adjacent to the descending colon and sigmoid colon. In the absence of trauma where this could represent mesenteric or occult bowel injury, this is most likely reactive/inflammatory. Nonspecific lymph nodes in the preaortic/periaortic nodal stations, base of the mesentery, and within the upper abdomen. Again, this may represent inflammatory/reactive changes, however, if there is concern for lymphoproliferative disorder, recommend correlation with lab values. Signed, Yvone Neu. Loreta Ave, DO [facility] Radiology Electronically Signed   By: Gilmer Mor D.O.   On: 03/08/2016 14:29    Medications: . sodium chloride   Intravenous Once  . pantoprazole  40 mg Oral BID  . sodium chloride flush  3 mL Intravenous Q12H  . thiamine  100 mg Oral Daily   . sodium chloride 125 mL/hr at 03/08/16 7829    Assessment/Plan Recurrent GI bleed with negative CT angiogram today Anemia - transfused 03/08/16 2 units of PRBC GERD Headaches - BC powders at home about 2 times per week Sleep apnea/snoring - per wife Hx of ETOH use - quit 10/2015   FEN:  IV fluid/ clear liquids ID:  No ABX DVT:  SCD   Plan:  No further bleeding.  Nothing surgical at this point.  We will be available as needed please call.     LOS: 6 days    Griffey Nicasio 03/09/2016 [phone]

## 2016-03-09 NOTE — Progress Notes (Signed)
PROGRESS NOTE   Aaron Wilkerson  ONG:[id number] DOB: Jul 31, 1970 DOA: 03/03/2016 PCP: Kristian CoveyBURCHETTE,BRUCE W, MD   Brief Narrative:  Aaron Wilkerson is a 46 y.o. male with no significant medical history, who presents to the hospital chief complaint of rectal bleeding. Approximately 10 AM yesterday morning he noticed significant bright red blood per rectum, it has been persistent, with no improving or worsening factors, it has been associated with nausea, mild lower abdominal pain, dizziness and orthostatic symptoms. Patient suffered syncope while going from his bedroom to the bathroom, experiencing head trauma. So far patient had at least 3 large bloody bowel movements. Due to his persistent symptoms he came to the hospital for further evaluation. Was worked up and admitted for Lower GI bleed and was evaluated by Gastroenterology and underwent Upper Endoscopy and Colonoscopy yesterday. Has not had a bowel movement since colonoscopy; Diet advanced to regular diet by Gastroenterology. Patient was going to be D/C'd n 03/06/16 however became suddenly dizzy and nauseous upon standing so discharge was canceled and patient was bolused 1 liter and held to re-evaluate in AM and repeat Orthostatics. On 03/07/16 AM patient remained dizzy and had a headache so a CT of the Head was ordered and he was bolused another liter of fluid. Maintence fluid was increased to 125 mL/hr and hour and TED hose were provided. Had a Syncopal Episode overnight and had a large bloody bowel movement and dropped his Hb to 6.9. He is going to be transfused 2 units of pRBC's and undergo CTA. GI was re-consulted 03/08/16 and IR was notified. CTA done and showed no GI hemorrhage but General Surgery consulted incase patient clinically deteriorates. Patient's Hb improved appropriately and patient still remained slightly dizzy. GI advanced his diet and recommended keeping patient to observe   Assessment & Plan:   Principal Problem:   GI bleeding Active  Problems:   Rectal bleeding   Acute blood loss anemia   Erosive gastropathy   Syncope   Diverticulosis of colon with hemorrhage   Dizziness   Alcohol use disorder, mild, in sustained remission  Acute Lower GI bleed Likely 2/2 to Diverticulosis -Patient's Hb/Hct went from 13.1/38.3 -> 6.9/19.9 -> 9.0/25.6 -Appreciate Gastroenterology Evaluation and recc's -FOBT Positive -Patent is s/p Colonoscopy: Showed Blood in the entire examined colon and diverticulosis in the distal ascending colon was noted and was suspected to be the site of bleeding. Clips were place and it was tattooed with Carbon Black. The examined portion of the ileum was normal and patient was found to have non-bleeding internal hemorrhiods. Otherwise examination was normal and bowel prep was inadequate for screening purposes and repeat colonoscopy was recommended at age 46 -Patient is also s/p Endoscopy to R/o Upper GI Bleed: Showed Esophagogastric landmarks identified, with normal esophagus, erosive gastropathy, and Erythematous duodenopathy and suspected changes in the stomach and duodenum due to NSAID use. Will r/o H. Pylori. -CT-Angio ordered to evaluate as patient had reccurrent bleed with significant drop in Hb and Syncopal episode; Did not show active bleeding; May need Tagged RBC scan -Interventional Radiology notified and discussed with Interventional Radiology Dr. Deanne CofferHassell; no acute finding was seen with no evidence of Gastrointestinal Hemorrhage; If continues to deteriorate will get tagged RBC Scan; -General Surgery notified as back up and for additional Reccs; No Surgical Intervention as no further bleeding -C/w NS at 100 mL/hr again as patient became dizzy  -Clear Liquid Diet advanced by Gastroenterology; Recommended by Gastroenterology to keep overnight given that he rebled and required blood  yesterday.  -C/w po Protonix 40 mg Daily -Repeat CBC in AM  Acute Blood Loss Anemia s/p Transfusion of 2 units of  pRBC -Patient's Hb/Hct went from 13.1/38.3 ->  6.9/19.9 -> 9.0/25.6 -S/p Transfusion of 2 units of pRBC's  -CTA to Evaluate and did not show evidence of gastrointestinal hemorrhage -As Above -Repeat CBC in AM  Erosive Gastropathy and Erythematous Duodenopathy -EGD Findings as above -C/w po Protonix -H Pylori Serology Negative as <9.0  Syncope and Dizziness -? Vertigo; If worsens may consider MRI -Head CT w/o Contrast Negative and showed no acute intracranial process identified -Likely from Acute Blood Loss/Orthostasis/Vasovagal; Transfused 2 units of pRBC's -Repeat Orthostatics in Am -C/w Maintenance Fluid to 100 mL/hr  -Ordered TED Hose -Continue to Monitor  Headache -Continue with Acetaminophen and Tramadol -Will try Fiorcet; Avoid NSAIDs in the setting of GI Bleed   Anxiety -Continue Clonazepam 0.5 mg po BIDprn  DVT prophylaxis: SCDs Code Status: FULL CODE Family Communication: Parents are at bedside Disposition Plan: Home Likely when stable  Consultants:   Gastroenterology  Interventional Radiology  General Surgery   Procedures: Upper Endoscopy and Colonoscopy; CT Abdomen   Antimicrobials: None  Subjective: Seen and examined and had No N/V but complained of a headache still and some dizziness. No reoccurring Bloody bowel movement.    Objective: Vitals:   03/09/16 1019 03/09/16 1022 03/09/16 1024 03/09/16 1400  BP: (!) 155/89 (!) 147/101 (!) 148/96 (!) 144/93  Pulse: 85 98 (!) 101 81  Resp: 18   19  Temp:    98.4 F (36.9 C)  TempSrc:    Oral  SpO2: 100% 100% 100% 98%  Weight:      Height:        Intake/Output Summary (Last 24 hours) at 03/09/16 1857 Last data filed at 03/09/16 1839  Gross per 24 hour  Intake             2223 ml  Output             1400 ml  Net              823 ml   Filed Weights   03/03/16 1358 03/03/16 2052 03/04/16 0848  Weight: 90.7 kg (200 lb) 89.4 kg (197 lb 1.5 oz) 89.4 kg (197 lb)   Examination: Physical  Exam:  Constitutional: WN/WD, NAD and calm Eyes: Lids normal; conjunctival pallor improved. sclerae anicteric  ENMT: External Ears, Nose appear normal. Grossly normal hearing. Neck: Appears normal, supple, no cervical masses, normal ROM, no appreciable thyromegaly, no JVD Respiratory: Clear to auscultation bilaterally, no wheezing, rales, rhonchi or crackles. Normal respiratory effort and patient is not tachypenic. No accessory muscle use.  Cardiovascular: RRR, no murmurs / rubs / gallops. S1 and S2 auscultated. No extremity edema.  Abdomen: Soft, non-tender, mildly disenteded. No masses palpated. No appreciable hepatosplenomegaly. Bowel sounds positive x4.  GU: Deferred. Musculoskeletal: No clubbing / cyanosis of digits/nails. No joint deformity upper and lower extremities.  Skin: No rashes, lesions, ulcers. No induration; Warm and dry.  Neurologic: CN 2-12 grossly intact with no focal deficits. Romberg sign cerebellar reflexes not assessed.  Psychiatric: Normal judgment and insight. Alert and oriented x 3. Normal mood and appropriate affect.   Data Reviewed: I have personally reviewed following labs and imaging studies  CBC:  Recent Labs Lab 03/05/16 0506  03/06/16 0530  03/07/16 0535 03/07/16 2359 03/08/16 0515 03/09/16 0514 03/09/16 1409  WBC 4.4  --  4.7  --  4.7  --  5.7 5.0  --   NEUTROABS 2.2  --  2.5  --  2.5  --  3.4 2.5  --   HGB 9.4*  < > 9.6*  < > 9.6* 7.6* 6.9* 8.6* 9.0*  HCT 27.5*  < > 27.7*  < > 28.0* 21.4* 19.9* 24.5* 25.6*  MCV 90.8  --  91.7  --  91.8  --  89.6 87.2  --   PLT 108*  --  102*  --  122*  --  113* 125*  --   < > = values in this interval not displayed. Basic Metabolic Panel:  Recent Labs Lab 03/05/16 0506 03/06/16 0530 03/07/16 0535 03/08/16 0515 03/09/16 0514  NA 139 139 139 139 140  K 3.9 3.9 4.2 4.0 3.8  CL 107 107 107 111 110  CO2 27 27 27 25 25   GLUCOSE 123* 118* 123* 122* 93  BUN 8 8 9 11 14   CREATININE 0.67 0.75 0.75 0.72 0.67   CALCIUM 8.4* 8.5* 8.8* 7.9* 8.2*  MG 1.7 1.8 1.8 1.5* 1.6*  PHOS 3.4 4.1 4.3 3.4 3.7   GFR: Estimated Creatinine Clearance: 129.9 mL/min (by C-G formula based on SCr of 0.67 mg/dL). Liver Function Tests:  Recent Labs Lab 03/05/16 0506 03/06/16 0530 03/07/16 0535 03/08/16 0515 03/09/16 0514  AST 26 27 26 21 22   ALT 22 24 24 20 22   ALKPHOS 59 76 75 62 52  BILITOT 1.0 0.7 0.6 0.6 1.0  PROT 5.6* 5.7* 6.0* 4.7* 5.3*  ALBUMIN 3.3* 3.6 3.5 2.8* 3.1*   No results for input(s): LIPASE, AMYLASE in the last 168 hours. No results for input(s): AMMONIA in the last 168 hours. Coagulation Profile:  Recent Labs Lab 03/03/16 1416  INR 1.17   Cardiac Enzymes: No results for input(s): CKTOTAL, CKMB, CKMBINDEX, TROPONINI in the last 168 hours. BNP (last 3 results) No results for input(s): PROBNP in the last 8760 hours. HbA1C: No results for input(s): HGBA1C in the last 72 hours. CBG:  Recent Labs Lab 03/03/16 1426  GLUCAP 106*   Lipid Profile: No results for input(s): CHOL, HDL, LDLCALC, TRIG, CHOLHDL, LDLDIRECT in the last 72 hours. Thyroid Function Tests: No results for input(s): TSH, T4TOTAL, FREET4, T3FREE, THYROIDAB in the last 72 hours. Anemia Panel: No results for input(s): VITAMINB12, FOLATE, FERRITIN, TIBC, IRON, RETICCTPCT in the last 72 hours. Sepsis Labs: No results for input(s): PROCALCITON, LATICACIDVEN in the last 168 hours.  No results found for this or any previous visit (from the past 240 hour(s)).   Radiology Studies: Ct Angio Abd/pel W/ And/or W/o  Result Date: 03/08/2016 CLINICAL DATA:  46 year old male with a history of recurrent hematochezia. Colonoscopy demonstrates diverticula, with clipping performed EXAM: CTA ABDOMEN AND PELVIS WITH CONTRAST TECHNIQUE: Multidetector CT imaging of the abdomen and pelvis was performed using the standard protocol during bolus administration of intravenous contrast. Multiplanar reconstructed images and MIPs were obtained  and reviewed to evaluate the vascular anatomy. CONTRAST:  100 cc Isovue 370 COMPARISON:  None. FINDINGS: VASCULAR Aorta: Unremarkable course, caliber, contour of the abdominal aorta. No dissection, aneurysm, or periaortic fluid. Celiac: No significant atherosclerotic changes at the origin of the celiac artery. Typical branch pattern of the celiac artery, with left gastric, common hepatic, and splenic artery identified. SMA: No significant atherosclerotic changes of the superior mesenteric artery. Renals: Renal arteries are patent. Single left renal artery. Two right renal artery. IMA: Inferior mesenteric artery is patent. Left colic artery patent. Superior rectal artery patent. Right lower  extremity: Unremarkable course, caliber, and contour of the right iliac system. No aneurysm, dissection, or occlusion. Hypogastric artery is patent. Anterior and posterior division patent. Common femoral artery patent. Proximal SFA and profunda femoris patent. Left lower extremity: Unremarkable course, caliber, and contour of the left iliac system. No aneurysm, dissection, or occlusion. Hypogastric artery is patent. Anterior and posterior division patent. Common femoral artery patent. Proximal SFA and profunda femoris patent. Veins: Unremarkable appearance of the venous system. Review of the MIP images confirms the above findings. NON-VASCULAR Lower chest: Unremarkable. Hepatobiliary: Unremarkable appearance of the liver. Unremarkable gall bladder. Pancreas: Unremarkable appearance of the pancreas. No pericholecystic fluid or inflammatory changes. Unremarkable ductal system. Spleen: Unremarkable. Adrenals/Urinary Tract: Unremarkable appearance of adrenal glands. Right: No hydronephrosis. Symmetric perfusion to the left. No nephrolithiasis. Unremarkable course of the right ureter. Left: No hydronephrosis. Symmetric perfusion to the right. No nephrolithiasis. Unremarkable course of the left ureter. Distention of the urinary bladder.  Stomach/Bowel: Unremarkable appearance of the stomach. Unremarkable appearance of small bowel. No evidence of obstruction. Normal appendix. Colonic diverticula. There are adjacent surgical clips within the ascending colon compatible with given history. No evidence of pooling contrast or a extravasation of contrast to suggest hemorrhage. Lymphatic: There are small lymph nodes within the para-aortic/preaortic nodal station along the aorta. There are small lymph nodes within the base of the mesenteric. Portacaval node measures 12 mm-13 mm. Node adjacent to pancreatic head measures 17 mm. Mesenteric: Trace free fluid within the lower abdomen adjacent to the descending colon and within the mid abdomen adjacent to the sigmoid. Reproductive: Unremarkable appearance of the pelvic organs. Other: No hernia. Musculoskeletal: No evidence of acute fracture. No bony canal narrowing. No significant degenerative changes of the hips. IMPRESSION: No acute finding, with no evidence of gastrointestinal hemorrhage. Surgical clips in the ascending colon, compatible with the given history of colonoscopy and clipping. Nonspecific trace free fluid within the abdomen adjacent to the descending colon and sigmoid colon. In the absence of trauma where this could represent mesenteric or occult bowel injury, this is most likely reactive/inflammatory. Nonspecific lymph nodes in the preaortic/periaortic nodal stations, base of the mesentery, and within the upper abdomen. Again, this may represent inflammatory/reactive changes, however, if there is concern for lymphoproliferative disorder, recommend correlation with lab values. Signed, Yvone Neu. Loreta Ave, DO [facility] Radiology Electronically Signed   By: Gilmer Mor D.O.   On: 03/08/2016 14:29     Upper GI Endoscopy                           - Esophagogastric landmarks identified.                           - Normal esophagus otherwise                            - Erosive gastropathy.                           - Erythematous duodenopathy.                           Suspect changes in the stomach and duodenum due to  NSAIDs, but would rule out H pylori.  Colonoscopy       - Preparation of the colon was fair.                           - Blood in the entire examined colon.                           - Diverticulosis in the distal ascending colon,                            suspect this may have been the site of bleeding.                            Clips were placed. Tattooed.                           - The examined portion of the ileum was normal.                           - Non-bleeding internal hemorrhoids.                           - The examination was otherwise normal. Bowel prep                            was inadequate for screening purposes.   Scheduled Meds: . sodium chloride   Intravenous Once  . pantoprazole  40 mg Oral BID  . sodium chloride flush  3 mL Intravenous Q12H  . thiamine  100 mg Oral Daily   Continuous Infusions: . sodium chloride 125 mL/hr at 03/09/16 1147    LOS: 6 days   Merlene Laughter, DO [facility] Pager [phone]  If 7PM-7AM, please contact night-coverage [email or web address] Password [facility] 03/09/2016, 6:57 PM

## 2016-03-09 NOTE — Progress Notes (Signed)
     [facility] Progress Note  Chief Complaint:   GI bleed  Subjective: No further bleeding or BMs. He still feels a little dizzy. Has difficulty describing his dizziness but doesn't sound like vertigo. Family present and recalls patient complaining of dizziness days before admission.   Objective:  Vital signs in last 24 hours: Temp:  [97.7 F (36.5 C)-98.5 F (36.9 C)] 98.1 F (36.7 C) (01/23 0536) Pulse Rate:  [63-86] 63 (01/23 0536) Resp:  [16-18] 16 (01/23 0536) BP: (122-152)/(78-97) 122/78 (01/23 0536) SpO2:  [99 %-100 %] 99 % (01/23 0536) Last BM Date: 03/08/16 General:   Alert, well-developed white male in NAD EENT:  Normal hearing, non icteric sclera, conjunctive pink.  Heart:  Regular rate and rhythm; no murmurs.no lower extremity edema Pulm: Normal respiratory effort, lungs CTA bilaterally without wheezes or crackles. Abdomen:  Soft, nondistended, nontender.  Normal bowel sounds, no masses felt. No hepatomegaly.    Neurologic:  Alert and  oriented x4;  grossly normal neurologically. Psych:  Alert and cooperative. Normal mood and affect.   Intake/Output from previous day: 01/22 0701 - 01/23 0700 In: 2754.1 [I.V.:2083.3; Blood:670.8] Out: 2300 [Urine:2300] Intake/Output this shift: No intake/output data recorded.  Lab Results:  Recent Labs  03/07/16 0535 03/07/16 2359 03/08/16 0515 03/09/16 0514  WBC 4.7  --  5.7 5.0  HGB 9.6* 7.6* 6.9* 8.6*  HCT 28.0* 21.4* 19.9* 24.5*  PLT 122*  --  113* 125*   BMET  Recent Labs  03/07/16 0535 03/08/16 0515 03/09/16 0514  NA 139 139 140  K 4.2 4.0 3.8  CL 107 111 110  CO2 27 25 25   GLUCOSE 123* 122* 93  BUN 9 11 14   CREATININE 0.75 0.72 0.67  CALCIUM 8.8* 7.9* 8.2*   LFT  Recent Labs  03/09/16 0514  PROT 5.3*  ALBUMIN 3.1*  AST 22  ALT 22  ALKPHOS 52  BILITOT 1.0   Assessment / Plan:  1. Lower GI bleed felt to be from diverticulum in hepatic flexure area. Area ulcerated / friable,  endo clips placed and area tatooed. Night before last he did rebleed with an associated drop in hgb and syncope. Spoke with IR yesterday and they recommended CTA which was done and negative. Surgery evaluated as well.  -recommend keeping patient tonight given that he rebled a couple of days ago and required blood. Will give him a diet. I asked Nursing Assistant to ambulate him with assistance.  -am CBC  2. Anemia of acute blood loss. Hgb up from 6.9 to 8.6 post 2 units of blood yesterday .  -am CBC  Principal Problem:   GI bleeding Active Problems:   Rectal bleeding   Acute blood loss anemia   Erosive gastropathy   Syncope   Diverticulosis of colon with hemorrhage   Dizziness   Alcohol use disorder, mild, in sustained remission   LOS: 6 days   Willette ClusterPaula Guenther NP 03/09/2016, 9:19 AM Pager number [phone]  GI ATTENDING  Interval history data reviewed. Patient seen and examined. Agree with interval progress note. No recurrent bleeding since last episode. Agree with advancing diet and ongoing observation.  Wilhemina BonitoJohn N. Eda KeysPerry, Jr., M.D. [facility] Division of Gastroenterology

## 2016-03-10 DIAGNOSIS — I10 Essential (primary) hypertension: Secondary | ICD-10-CM

## 2016-03-10 DIAGNOSIS — R42 Dizziness and giddiness: Secondary | ICD-10-CM

## 2016-03-10 LAB — COMPREHENSIVE METABOLIC PANEL
ALT: 22 U/L (ref 17–63)
AST: 24 U/L (ref 15–41)
Albumin: 3.2 g/dL — ABNORMAL LOW (ref 3.5–5.0)
Alkaline Phosphatase: 58 U/L (ref 38–126)
Anion gap: 7 (ref 5–15)
BUN: 11 mg/dL (ref 6–20)
CO2: 25 mmol/L (ref 22–32)
Calcium: 8.3 mg/dL — ABNORMAL LOW (ref 8.9–10.3)
Chloride: 108 mmol/L (ref 101–111)
Creatinine, Ser: 0.76 mg/dL (ref 0.61–1.24)
GFR calc Af Amer: >60 mL/min (ref >60)
GFR calc non Af Amer: >60 mL/min (ref >60)
Glucose, Bld: 104 mg/dL — ABNORMAL HIGH (ref 65–99)
Potassium: 3.9 mmol/L (ref 3.5–5.1)
Sodium: 140 mmol/L (ref 135–145)
Total Bilirubin: 0.4 mg/dL (ref 0.3–1.2)
Total Protein: 5.1 g/dL — ABNORMAL LOW (ref 6.5–8.1)

## 2016-03-10 LAB — CBC WITH DIFFERENTIAL/PLATELET
Basophils Absolute: 0 10*3/uL (ref 0.0–0.1)
Basophils Relative: 1 %
Eosinophils Absolute: 0.1 10*3/uL (ref 0.0–0.7)
Eosinophils Relative: 2 %
HCT: 23.6 % — ABNORMAL LOW (ref 39.0–52.0)
Hemoglobin: 8.3 g/dL — ABNORMAL LOW (ref 13.0–17.0)
Lymphocytes Relative: 41 %
Lymphs Abs: 1.6 10*3/uL (ref 0.7–4.0)
MCH: 31.1 pg (ref 26.0–34.0)
MCHC: 35.2 g/dL (ref 30.0–36.0)
MCV: 88.4 fL (ref 78.0–100.0)
Monocytes Absolute: 0.3 10*3/uL (ref 0.1–1.0)
Monocytes Relative: 8 %
Neutro Abs: 1.8 10*3/uL (ref 1.7–7.7)
Neutrophils Relative %: 48 %
Platelets: 119 10*3/uL — ABNORMAL LOW (ref 150–400)
RBC: 2.67 MIL/uL — ABNORMAL LOW (ref 4.22–5.81)
RDW: 13.6 % (ref 11.5–15.5)
WBC: 3.8 10*3/uL — ABNORMAL LOW (ref 4.0–10.5)

## 2016-03-10 LAB — PHOSPHORUS: Phosphorus: 3.9 mg/dL (ref 2.5–4.6)

## 2016-03-10 LAB — MAGNESIUM: Magnesium: 1.8 mg/dL (ref 1.7–2.4)

## 2016-03-10 MED ORDER — MECLIZINE HCL 25 MG PO TABS: 25.0000 mg | ORAL_TABLET | Freq: Two times a day (BID) | ORAL | 0 refills | Status: DC | PRN

## 2016-03-10 MED ORDER — FERROUS SULFATE 325 (65 FE) MG PO TABS: 325.0000 mg | ORAL_TABLET | Freq: Three times a day (TID) | ORAL | 1 refills | Status: DC

## 2016-03-10 MED ORDER — FERROUS SULFATE 325 (65 FE) MG PO TABS
325.0000 mg | ORAL_TABLET | Freq: Three times a day (TID) | ORAL | Status: DC
  Administered 2016-03-10: 325 mg via ORAL
  Filled 2016-03-10: qty 1

## 2016-03-10 MED ORDER — AMLODIPINE BESYLATE 2.5 MG PO TABS: 2.5000 mg | ORAL_TABLET | Freq: Every day | ORAL | 0 refills | Status: DC

## 2016-03-10 MED ORDER — MECLIZINE HCL 25 MG PO TABS
25.0000 mg | ORAL_TABLET | Freq: Two times a day (BID) | ORAL | Status: DC
  Administered 2016-03-10: 25 mg via ORAL
  Filled 2016-03-10: qty 1

## 2016-03-10 NOTE — Progress Notes (Signed)
[facility] Progress Note  Chief Complaint:   GI bleed  Subjective: No further bleeding. Ambulating in halls. Feels fine.   Objective:  Vital signs in last 24 hours: Temp:  [98.4 F (36.9 C)-98.5 F (36.9 C)] 98.5 F (36.9 C) (01/24 0550) Pulse Rate:  [66-81] 74 (01/24 0550) Resp:  [19-20] 20 (01/24 0550) BP: (118-144)/(72-93) 132/84 (01/24 0550) SpO2:  [98 %-100 %] 100 % (01/24 0550) Weight:  [212 lb 4.9 oz (96.3 kg)] 212 lb 4.9 oz (96.3 kg) (01/24 0550) Last BM Date: 03/07/16 General:   Alert, well-developed,    in NAD EENT:  Normal hearing, non icteric sclera, conjunctive pink.  Heart:  Regular rate and rhythm; no murmurs.  lower extremity edema Pulm: Normal respiratory effort, lungs CTA bilaterally without wheezes or crackles. Abdomen:  Soft, nondistended, nontender.  Normal bowel sounds, no masses felt. No hepatomegaly.    Neurologic:  Alert and  oriented x4;  grossly normal neurologically. Psych:  Alert and cooperative. Normal mood and affect. Skin:   Intake/Output from previous day: 01/23 0701 - 01/24 0700 In: 3748 [P.O.:960; I.V.:2788] Out: 2302 [Urine:2302] Intake/Output this shift: Total I/O In: 240 [P.O.:240] Out: 1000 [Urine:1000]  Lab Results:  Recent Labs  03/08/16 0515 03/09/16 0514 03/09/16 1409 03/10/16 0554  WBC 5.7 5.0  --  3.8*  HGB 6.9* 8.6* 9.0* 8.3*  HCT 19.9* 24.5* 25.6* 23.6*  PLT 113* 125*  --  119*   BMET  Recent Labs  03/08/16 0515 03/09/16 0514 03/10/16 0554  NA 139 140 140  K 4.0 3.8 3.9  CL 111 110 108  CO2 25 25 25   GLUCOSE 122* 93 104*  BUN 11 14 11   CREATININE 0.72 0.67 0.76  CALCIUM 7.9* 8.2* 8.3*   LFT  Recent Labs  03/10/16 0554  PROT 5.1*  ALBUMIN 3.2*  AST 24  ALT 22  ALKPHOS 58  BILITOT 0.4    Ct Angio Abd/pel W/ And/or W/o  Result Date: 03/08/2016 CLINICAL DATA:  46 year old male with a history of recurrent hematochezia. Colonoscopy demonstrates diverticula, with clipping  performed EXAM: CTA ABDOMEN AND PELVIS WITH CONTRAST TECHNIQUE: Multidetector CT imaging of the abdomen and pelvis was performed using the standard protocol during bolus administration of intravenous contrast. Multiplanar reconstructed images and MIPs were obtained and reviewed to evaluate the vascular anatomy. CONTRAST:  100 cc Isovue 370 COMPARISON:  None. FINDINGS: VASCULAR Aorta: Unremarkable course, caliber, contour of the abdominal aorta. No dissection, aneurysm, or periaortic fluid. Celiac: No significant atherosclerotic changes at the origin of the celiac artery. Typical branch pattern of the celiac artery, with left gastric, common hepatic, and splenic artery identified. SMA: No significant atherosclerotic changes of the superior mesenteric artery. Renals: Renal arteries are patent. Single left renal artery. Two right renal artery. IMA: Inferior mesenteric artery is patent. Left colic artery patent. Superior rectal artery patent. Right lower extremity: Unremarkable course, caliber, and contour of the right iliac system. No aneurysm, dissection, or occlusion. Hypogastric artery is patent. Anterior and posterior division patent. Common femoral artery patent. Proximal SFA and profunda femoris patent. Left lower extremity: Unremarkable course, caliber, and contour of the left iliac system. No aneurysm, dissection, or occlusion. Hypogastric artery is patent. Anterior and posterior division patent. Common femoral artery patent. Proximal SFA and profunda femoris patent. Veins: Unremarkable appearance of the venous system. Review of the MIP images confirms the above findings. NON-VASCULAR Lower chest: Unremarkable. Hepatobiliary: Unremarkable appearance of the liver. Unremarkable gall bladder. Pancreas: Unremarkable appearance of  the pancreas. No pericholecystic fluid or inflammatory changes. Unremarkable ductal system. Spleen: Unremarkable. Adrenals/Urinary Tract: Unremarkable appearance of adrenal glands. Right:  No hydronephrosis. Symmetric perfusion to the left. No nephrolithiasis. Unremarkable course of the right ureter. Left: No hydronephrosis. Symmetric perfusion to the right. No nephrolithiasis. Unremarkable course of the left ureter. Distention of the urinary bladder. Stomach/Bowel: Unremarkable appearance of the stomach. Unremarkable appearance of small bowel. No evidence of obstruction. Normal appendix. Colonic diverticula. There are adjacent surgical clips within the ascending colon compatible with given history. No evidence of pooling contrast or a extravasation of contrast to suggest hemorrhage. Lymphatic: There are small lymph nodes within the para-aortic/preaortic nodal station along the aorta. There are small lymph nodes within the base of the mesenteric. Portacaval node measures 12 mm-13 mm. Node adjacent to pancreatic head measures 17 mm. Mesenteric: Trace free fluid within the lower abdomen adjacent to the descending colon and within the mid abdomen adjacent to the sigmoid. Reproductive: Unremarkable appearance of the pelvic organs. Other: No hernia. Musculoskeletal: No evidence of acute fracture. No bony canal narrowing. No significant degenerative changes of the hips. IMPRESSION: No acute finding, with no evidence of gastrointestinal hemorrhage. Surgical clips in the ascending colon, compatible with the given history of colonoscopy and clipping. Nonspecific trace free fluid within the abdomen adjacent to the descending colon and sigmoid colon. In the absence of trauma where this could represent mesenteric or occult bowel injury, this is most likely reactive/inflammatory. Nonspecific lymph nodes in the preaortic/periaortic nodal stations, base of the mesentery, and within the upper abdomen. Again, this may represent inflammatory/reactive changes, however, if there is concern for lymphoproliferative disorder, recommend correlation with lab values. Signed, Yvone NeuJaime S. Loreta AveWagner, DO [facility] [address] [facility] Electronically Signed   By: Gilmer MorJaime  Wagner D.O.   On: 03/08/2016 14:29    Assessment / Plan:  1. Lower GI bleed felt to be from ulcerated diverticulum at hepatic flexure. He rebled a couple of nights ago. CTA negative. No further bleeding. He is tolerating diet, ambulating.  -Patient stable for discharge from GI standpoint  2. ABL. Hgb up from 6.9 to 9 yesterday but down some to 8.3 this am. Suspect equilibration. No active bleeding.  - Recommend BID iron supplement for next 4-6 weeks.    Principal Problem:   GI bleeding Active Problems:   Rectal bleeding   Acute blood loss anemia   Erosive gastropathy   Syncope   Diverticulosis of colon with hemorrhage   Dizziness   Alcohol use disorder, mild, in sustained remission    LOS: 7 days   Willette ClusterPaula Guenther NP 03/10/2016, 11:36 AM Pager number [phone]  GI ATTENDING  Interval history data reviewed. Agree with assessment and plans as outlined. Outpatient GI follow-up with Dr. Adela LankArmbruster as needed  Wilhemina BonitoJohn N. Eda KeysPerry, Jr., M.D. [facility]

## 2016-03-10 NOTE — Discharge Summary (Signed)
Physician Discharge Summary  Aaron Wilkerson  WJX:[id number]  DOB: 10-Mar-1970  DOA: 03/03/2016 PCP: Aaron Covey, MD  Admit date: 03/03/2016 Discharge date: 03/10/2016  Admitted From: Home  Disposition:  Home   Recommendations for Outpatient Follow-up:  1. Follow up with PCP in 1-2 weeks 2. Please obtain BMP/CBC in one week to monitor for anemia   Discharge Condition: Improved  CODE STATUS: FULL  Diet recommendation: Heart Healthy   Brief/Interim Summary: HPI by Dr Aaron Wilkerson a 45 y.o.malewith no significant medical history, who presented to the hospital chief complaint of rectal bleeding. He noticed significant bright red blood per rectum, it has been persistent, with no improving or worsening factors, it has been associated with nausea, mild lower abdominal pain, dizziness and orthostatic symptoms. Patient suffered syncope while going from his bedroom to the bathroom, experiencing head trauma. So far patient had at least 3 large bloody bowel movements. Due to his persistent symptoms he came to the hospital for further evaluation. Was worked up and admitted for Lower GI bleed and was evaluated by Gastroenterology and underwent Upper Endoscopy and Colonoscopy. Has not had a bowel movement since colonoscopy; Diet advanced to regular diet by Gastroenterology. Patient was going to be D/C'd n 03/06/16 however became suddenly dizzy and nauseous upon standing so discharge was canceled and patient was bolused 1 liter and held to re-evaluate in AM and repeat Orthostatics. On 03/07/16 AM patient remained dizzy and had a headache so a CT of the Head was ordered which was negative. Maintence fluid was increased to 125 mL/hr and hour and TED hose were provided. Had a Syncopal Episode overnight and had a large bloody bowel movement and dropped his Hb to 6.9. He was transfused 2 units of pRBC's and undergo CTA. GI was re-consulted 03/08/16 and IR was notified. CTA done and showed no GI  hemorrhage. Patient's Hb improved appropriately and patient still remained slightly dizzy. GI advanced his diet and recommended cleared patient for discharge. Patient was given meclizine which improve his symptoms. Patients BP was slight elevated during the hospital stay, and patient revealed that he was told that his BP was elevated, but no medications were started. Norvasc was added. Patient now clinically stable for discharge and will follow up with PCP   Subjective: Patient seen and examined with family at bedside. Patient describe symptoms of vertigo, nausea, dizziness when moving head around, this has been going on for over 3 months. Orthostatics were negative,   Discharge Diagnoses/Hospital Course:  Acute Lower GI bleed 2/2 to Diverticulosis Hgb down to 6.9 from 15.9 back in sept 2017  FOBT Positive Patent is s/p Colonoscopy: Showed Blood in the entire examined colon and diverticulosis in the distal ascending colon was noted and was suspected to be the site of bleeding. Clips were place and it was tattooed with Carbon Black. The examined portion of the ileum was normal and patient was found to have non-bleeding internal hemorrhiods. Otherwise examination was normal and bowel prep was inadequate for screening purposes and repeat colonoscopy was recommended at age 87 Patient is also s/p Endoscopy to R/o Upper GI Bleed: Showed Esophagogastric landmarks identified, with normal esophagus, erosive gastropathy, and Erythematous duodenopathy and suspected changes in the stomach and duodenum due to NSAID use.  H Pylori negative  Patient had another episode of bleeding after colonoscopy therefore CT-Angio was done which was negative.  Continue Protonix 20 mg  Follow up with GI in 2 weeks   Acute Blood Loss Anemia S/p  2 packs of RBC  H/H has remained stable  Iron supplement added will need at least 4-6 weeks   Erosive Gastropathy and Erythematous Duodenopathy EGD Findings as above C/w PPI 40 mg  IV q12h H Pylori Serology ordered by Gastroenterology  Syncope  2/2 Acute Blood Loss/Orthostasis/Vasovagal   Anxiety Continue Clonazepam 0.5 mg po BIDprn  Vertigo - dizziness associate with nausea and nystagmus  Improved with Meclizine  Continue Meclizine  Follow up with PCP   New diagnosis of HTN  Started on Amlodipine 2.5 mg  Follow up with PCP   Discharge Instructions  You were cared for by a hospitalist during your hospital stay. If you have any questions about your discharge medications or the care you received while you were in the hospital after you are discharged, you can call the unit and asked to speak with the hospitalist on call if the hospitalist that took care of you is not available. Once you are discharged, your primary care physician will handle any further medical issues. Please note that NO REFILLS for any discharge medications will be authorized once you are discharged, as it is imperative that you return to your primary care physician (or establish a relationship with a primary care physician if you do not have one) for your aftercare needs so that they can reassess your need for medications and monitor your lab values.  Discharge Instructions    Call MD for:  difficulty breathing, headache or visual disturbances    Complete by:  As directed    Call MD for:  difficulty breathing, headache or visual disturbances    Complete by:  As directed    Call MD for:  extreme fatigue    Complete by:  As directed    Call MD for:  extreme fatigue    Complete by:  As directed    Call MD for:  hives    Complete by:  As directed    Call MD for:  persistant dizziness or light-headedness    Complete by:  As directed    Call MD for:  persistant dizziness or light-headedness    Complete by:  As directed    Call MD for:  persistant nausea and vomiting    Complete by:  As directed    Call MD for:  persistant nausea and vomiting    Complete by:  As directed    Call MD for:   redness, tenderness, or signs of infection (pain, swelling, redness, odor or green/yellow discharge around incision site)    Complete by:  As directed    Call MD for:  redness, tenderness, or signs of infection (pain, swelling, redness, odor or green/yellow discharge around incision site)    Complete by:  As directed    Call MD for:  severe uncontrolled pain    Complete by:  As directed    Call MD for:  severe uncontrolled pain    Complete by:  As directed    Call MD for:  temperature >100.4    Complete by:  As directed    Call MD for:  temperature >100.4    Complete by:  As directed    Diet - low sodium heart healthy    Complete by:  As directed    Diet - low sodium heart healthy    Complete by:  As directed    Discharge instructions    Complete by:  As directed    Follow up with PCP and GI as an outpatient.  Take all medications as prescribed. If symptoms worsen or change please return to ED for evaluation.   Discharge instructions    Complete by:  As directed    Increase activity slowly    Complete by:  As directed    Increase activity slowly    Complete by:  As directed      Allergies as of 03/10/2016   No Known Allergies     Medication List    STOP taking these medications   BC FAST PAIN RELIEF 650-195-33.3 MG Pack Generic drug:  Aspirin-Salicylamide-Caffeine   FLUoxetine 20 MG tablet Commonly known as:  PROZAC   promethazine 25 MG tablet Commonly known as:  PHENERGAN     TAKE these medications   amLODipine 2.5 MG tablet Commonly known as:  NORVASC Take 1 tablet (2.5 mg total) by mouth daily.   clonazePAM 0.5 MG tablet Commonly known as:  KLONOPIN Take 0.5 mg by mouth 2 (two) times daily as needed for anxiety.   ferrous sulfate 325 (65 FE) MG tablet Take 1 tablet (325 mg total) by mouth 3 (three) times daily with meals.   LORazepam 1 MG tablet Commonly known as:  ATIVAN Take 1 tablet (1 mg total) by mouth 3 (three) times daily as needed for anxiety.    meclizine 25 MG tablet Commonly known as:  ANTIVERT Take 1 tablet (25 mg total) by mouth 2 (two) times daily as needed for dizziness.   pantoprazole 40 MG tablet Commonly known as:  PROTONIX Take 1 tablet (40 mg total) by mouth daily.   VITAMIN B-1 PO Take 1 tablet by mouth daily.      Follow-up Information    Aaron Covey, MD. Call in 1 week(s).   Specialty:  Family Medicine Why:  Hospital Follow Up Contact information: [address] [place] [address] [phone]        Ruffin Frederick, MD. Call in 1 week(s).   Specialty:  Gastroenterology Why:  Follow up as needed with GI Clinic Contact information: [address] [place] [address] [phone]          No Known Allergies  Consultations:  GI Dr Adela Lank    Procedures/Studies: Ct Head Wo Contrast  Result Date: 03/07/2016 CLINICAL DATA:  Initial evaluation for syncope, fall, continued dizziness. EXAM: CT HEAD WITHOUT CONTRAST TECHNIQUE: Contiguous axial images were obtained from the base of the skull through the vertex without intravenous contrast. COMPARISON:  Prior CT from 01/29/2009. FINDINGS: Brain: Cerebral volume within normal limits for age. No acute intracranial hemorrhage. No evidence for acute large vessel territory infarct. No mass lesion, midline shift, or mass effect. No hydrocephalus. No extra-axial fluid collection. Vascular: No hyperdense vessel. Skull: Scalp soft tissues within normal limits.  Calvarium intact. Sinuses/Orbits: Globes and orbits within normal limits. Paranasal sinuses and mastoids are clear. IMPRESSION: Negative head CT.  No acute intracranial process identified. Electronically Signed   By: Rise Mu M.D.   On: 03/07/2016 19:50   Ct Angio Abd/pel W/ And/or W/o  Result Date: 03/08/2016 CLINICAL DATA:  46 year old male with a history of recurrent hematochezia. Colonoscopy demonstrates diverticula, with clipping performed EXAM: CTA  ABDOMEN AND PELVIS WITH CONTRAST TECHNIQUE: Multidetector CT imaging of the abdomen and pelvis was performed using the standard protocol during bolus administration of intravenous contrast. Multiplanar reconstructed images and MIPs were obtained and reviewed to evaluate the vascular anatomy. CONTRAST:  100 cc Isovue 370 COMPARISON:  None. FINDINGS: VASCULAR Aorta: Unremarkable course, caliber, contour  of the abdominal aorta. No dissection, aneurysm, or periaortic fluid. Celiac: No significant atherosclerotic changes at the origin of the celiac artery. Typical branch pattern of the celiac artery, with left gastric, common hepatic, and splenic artery identified. SMA: No significant atherosclerotic changes of the superior mesenteric artery. Renals: Renal arteries are patent. Single left renal artery. Two right renal artery. IMA: Inferior mesenteric artery is patent. Left colic artery patent. Superior rectal artery patent. Right lower extremity: Unremarkable course, caliber, and contour of the right iliac system. No aneurysm, dissection, or occlusion. Hypogastric artery is patent. Anterior and posterior division patent. Common femoral artery patent. Proximal SFA and profunda femoris patent. Left lower extremity: Unremarkable course, caliber, and contour of the left iliac system. No aneurysm, dissection, or occlusion. Hypogastric artery is patent. Anterior and posterior division patent. Common femoral artery patent. Proximal SFA and profunda femoris patent. Veins: Unremarkable appearance of the venous system. Review of the MIP images confirms the above findings. NON-VASCULAR Lower chest: Unremarkable. Hepatobiliary: Unremarkable appearance of the liver. Unremarkable gall bladder. Pancreas: Unremarkable appearance of the pancreas. No pericholecystic fluid or inflammatory changes. Unremarkable ductal system. Spleen: Unremarkable. Adrenals/Urinary Tract: Unremarkable appearance of adrenal glands. Right: No hydronephrosis.  Symmetric perfusion to the left. No nephrolithiasis. Unremarkable course of the right ureter. Left: No hydronephrosis. Symmetric perfusion to the right. No nephrolithiasis. Unremarkable course of the left ureter. Distention of the urinary bladder. Stomach/Bowel: Unremarkable appearance of the stomach. Unremarkable appearance of small bowel. No evidence of obstruction. Normal appendix. Colonic diverticula. There are adjacent surgical clips within the ascending colon compatible with given history. No evidence of pooling contrast or a extravasation of contrast to suggest hemorrhage. Lymphatic: There are small lymph nodes within the para-aortic/preaortic nodal station along the aorta. There are small lymph nodes within the base of the mesenteric. Portacaval node measures 12 mm-13 mm. Node adjacent to pancreatic head measures 17 mm. Mesenteric: Trace free fluid within the lower abdomen adjacent to the descending colon and within the mid abdomen adjacent to the sigmoid. Reproductive: Unremarkable appearance of the pelvic organs. Other: No hernia. Musculoskeletal: No evidence of acute fracture. No bony canal narrowing. No significant degenerative changes of the hips. IMPRESSION: No acute finding, with no evidence of gastrointestinal hemorrhage. Surgical clips in the ascending colon, compatible with the given history of colonoscopy and clipping. Nonspecific trace free fluid within the abdomen adjacent to the descending colon and sigmoid colon. In the absence of trauma where this could represent mesenteric or occult bowel injury, this is most likely reactive/inflammatory. Nonspecific lymph nodes in the preaortic/periaortic nodal stations, base of the mesentery, and within the upper abdomen. Again, this may represent inflammatory/reactive changes, however, if there is concern for lymphoproliferative disorder, recommend correlation with lab values. Signed, Yvone Neu. Loreta Ave, DO [facility]  [facility] Electronically Signed   By: Gilmer Mor D.O.   On: 03/08/2016 14:29   EGD  - Esophagogastric landmarks identified. - Normal esophagus otherwise - Erosive gastropathy. - Erythematous duodenopathy.  Colonoscopy   Preparation of the colon was fair. - Blood in the entire examined colon. - Diverticulosis in the distal ascending colon, suspect this may have been the site of bleeding. Clips were placed. Tattooed. - The examined portion of the ileum was normal. - Non-bleeding internal hemorrhoids. - The examination was otherwise normal. Bowel prep was inadequate for screening purposes.  Discharge Exam: Vitals:   03/10/16 0550 03/10/16 1316  BP: 132/84 (!) 149/95  Pulse: 74 77  Resp: 20 19  Temp: 98.5 F (36.9 C) 98.3 F (36.8 C)   Vitals:   03/09/16 1400 03/09/16 2302 03/10/16 0550 03/10/16 1316  BP: (!) 144/93 118/72 132/84 (!) 149/95  Pulse: 81 66 74 77  Resp: 19 20 20 19   Temp: 98.4 F (36.9 C) 98.5 F (36.9 C) 98.5 F (36.9 C) 98.3 F (36.8 C)  TempSrc: Oral Oral Oral Oral  SpO2: 98% 98% 100% 100%  Weight:   96.3 kg (212 lb 4.9 oz)   Height:        General: Pt is alert, awake, not in acute distress HEENT: Left eye lateral nystagmus, PERRL Cardiovascular: RRR, S1/S2 +, no rubs, no gallops Respiratory: CTA bilaterally, no wheezing, no rhonchi Abdominal: Soft, NT, ND, bowel sounds + Extremities: no edema, no cyanosis   The results of significant diagnostics from this hospitalization (including imaging, microbiology, ancillary and laboratory) are listed below for reference.     Microbiology: No results found for this or any previous visit (from the past 240 hour(s)).   Labs: BNP (last 3 results) No results for input(s): BNP in the last 8760 hours. Basic Metabolic Panel:  Recent Labs Lab 03/06/16 0530 03/07/16 0535 03/08/16 0515 03/09/16 0514 03/10/16 0554  NA 139 139 139 140 140  K 3.9 4.2 4.0 3.8 3.9  CL 107 107 111 110 108  CO2  27 27 25 25 25   GLUCOSE 118* 123* 122* 93 104*  BUN 8 9 11 14 11   CREATININE 0.75 0.75 0.72 0.67 0.76  CALCIUM 8.5* 8.8* 7.9* 8.2* 8.3*  MG 1.8 1.8 1.5* 1.6* 1.8  PHOS 4.1 4.3 3.4 3.7 3.9   Liver Function Tests:  Recent Labs Lab 03/06/16 0530 03/07/16 0535 03/08/16 0515 03/09/16 0514 03/10/16 0554  AST 27 26 21 22 24   ALT 24 24 20 22 22   ALKPHOS 76 75 62 52 58  BILITOT 0.7 0.6 0.6 1.0 0.4  PROT 5.7* 6.0* 4.7* 5.3* 5.1*  ALBUMIN 3.6 3.5 2.8* 3.1* 3.2*   No results for input(s): LIPASE, AMYLASE in the last 168 hours. No results for input(s): AMMONIA in the last 168 hours. CBC:  Recent Labs Lab 03/06/16 0530  03/07/16 0535 03/07/16 2359 03/08/16 0515 03/09/16 0514 03/09/16 1409 03/10/16 0554  WBC 4.7  --  4.7  --  5.7 5.0  --  3.8*  NEUTROABS 2.5  --  2.5  --  3.4 2.5  --  1.8  HGB 9.6*  < > 9.6* 7.6* 6.9* 8.6* 9.0* 8.3*  HCT 27.7*  < > 28.0* 21.4* 19.9* 24.5* 25.6* 23.6*  MCV 91.7  --  91.8  --  89.6 87.2  --  88.4  PLT 102*  --  122*  --  113* 125*  --  119*  < > = values in this interval not displayed. Cardiac Enzymes: No results for input(s): CKTOTAL, CKMB, CKMBINDEX, TROPONINI in the last 168 hours. BNP: Invalid input(s): POCBNP CBG: No results for input(s): GLUCAP in the last 168 hours. D-Dimer No results for input(s): DDIMER in the last 72 hours. Hgb A1c No results for input(s): HGBA1C in the last 72 hours. Lipid Profile No results for input(s): CHOL, HDL, LDLCALC, TRIG, CHOLHDL, LDLDIRECT in the last 72 hours. Thyroid function studies No results for input(s): TSH, T4TOTAL, T3FREE, THYROIDAB in the last 72 hours.  Invalid input(s): FREET3 Anemia work up No results for input(s): VITAMINB12, FOLATE, FERRITIN, TIBC, IRON, RETICCTPCT in the last 72 hours. Urinalysis No results found for: COLORURINE, APPEARANCEUR, LABSPEC, PHURINE, GLUCOSEU, HGBUR,  BILIRUBINUR, KETONESUR, PROTEINUR, UROBILINOGEN, NITRITE, LEUKOCYTESUR Sepsis Labs Invalid input(s):  PROCALCITONIN,  WBC,  LACTICIDVEN Microbiology No results found for this or any previous visit (from the past 240 hour(s)).   Time coordinating discharge: Over 30 minutes  SIGNED:  Latrelle Dodrill, MD  [facility] 03/10/2016, 10:07 PM Pager   If 7PM-7AM, please contact night-coverage [email or web address] Password [email or web address]

## 2016-03-11 ENCOUNTER — Other Ambulatory Visit: Payer: Self-pay | Admitting: Nurse Practitioner

## 2016-03-11 ENCOUNTER — Other Ambulatory Visit: Payer: Self-pay

## 2016-03-11 DIAGNOSIS — K922 Gastrointestinal hemorrhage, unspecified: Secondary | ICD-10-CM

## 2016-03-12 NOTE — Progress Notes (Signed)
Lab reminder mailed

## 2016-03-16 ENCOUNTER — Ambulatory Visit (INDEPENDENT_AMBULATORY_CARE_PROVIDER_SITE_OTHER): Payer: BLUE CROSS/BLUE SHIELD | Admitting: Family Medicine

## 2016-03-16 ENCOUNTER — Encounter: Payer: Self-pay | Admitting: Family Medicine

## 2016-03-16 VITALS — BP 124/84 | HR 115 | Temp 98.6°F | Ht 70.0 in | Wt 204.0 lb

## 2016-03-16 DIAGNOSIS — K3189 Other diseases of stomach and duodenum: Secondary | ICD-10-CM | POA: Diagnosis not present

## 2016-03-16 DIAGNOSIS — I10 Essential (primary) hypertension: Secondary | ICD-10-CM

## 2016-03-16 DIAGNOSIS — K5731 Diverticulosis of large intestine without perforation or abscess with bleeding: Secondary | ICD-10-CM

## 2016-03-16 LAB — CBC WITH DIFFERENTIAL/PLATELET
Basophils Absolute: 0.1 10*3/uL (ref 0.0–0.1)
Basophils Relative: 1.1 % (ref 0.0–3.0)
Eosinophils Absolute: 0.1 10*3/uL (ref 0.0–0.7)
Eosinophils Relative: 1.6 % (ref 0.0–5.0)
HCT: 33.7 % — ABNORMAL LOW (ref 39.0–52.0)
Hemoglobin: 11.3 g/dL — ABNORMAL LOW (ref 13.0–17.0)
Lymphocytes Relative: 32.3 % (ref 12.0–46.0)
Lymphs Abs: 2.1 10*3/uL (ref 0.7–4.0)
MCHC: 33.6 g/dL (ref 30.0–36.0)
MCV: 94.3 fl (ref 78.0–100.0)
Monocytes Absolute: 0.6 10*3/uL (ref 0.1–1.0)
Monocytes Relative: 8.8 % (ref 3.0–12.0)
Neutro Abs: 3.6 10*3/uL (ref 1.4–7.7)
Neutrophils Relative %: 56.2 % (ref 43.0–77.0)
Platelets: 236 10*3/uL (ref 150.0–400.0)
RBC: 3.58 Mil/uL — ABNORMAL LOW (ref 4.22–5.81)
RDW: 13.5 % (ref 11.5–15.5)
WBC: 6.4 10*3/uL (ref 4.0–10.5)

## 2016-03-16 LAB — BASIC METABOLIC PANEL
BUN: 14 mg/dL (ref 6–23)
CO2: 30 mEq/L (ref 19–32)
Calcium: 9.7 mg/dL (ref 8.4–10.5)
Chloride: 104 mEq/L (ref 96–112)
Creatinine, Ser: 0.96 mg/dL (ref 0.40–1.50)
GFR: 89.61 mL/min (ref >60.00)
Glucose, Bld: 93 mg/dL (ref 70–99)
Potassium: 4.6 mEq/L (ref 3.5–5.1)
Sodium: 139 mEq/L (ref 135–145)

## 2016-03-16 MED ORDER — AMLODIPINE BESYLATE 2.5 MG PO TABS: 2.5000 mg | ORAL_TABLET | Freq: Every day | ORAL | 6 refills | Status: DC

## 2016-03-16 MED ORDER — CLONAZEPAM 0.5 MG PO TABS: 0.5000 mg | ORAL_TABLET | Freq: Two times a day (BID) | ORAL | 0 refills | Status: DC | PRN

## 2016-03-16 MED ORDER — AMLODIPINE BESYLATE 2.5 MG PO TABS
2.5000 mg | ORAL_TABLET | Freq: Every day | ORAL | 6 refills | Status: DC
Start: 2016-03-16 — End: 2016-04-14

## 2016-03-16 MED ORDER — PANTOPRAZOLE SODIUM 40 MG PO TBEC: 40.0000 mg | DELAYED_RELEASE_TABLET | Freq: Every day | ORAL | 1 refills | Status: DC

## 2016-03-16 NOTE — Patient Instructions (Signed)
Taper off Protonix in about one month Stay on iron for the next month Follow up immediately for any recurrent bleeding or dizziness Stay off alcohol, aspirin, or any NSAIDS

## 2016-03-16 NOTE — Progress Notes (Signed)
Pre visit review using our clinic review tool, if applicable. No additional management support is needed unless otherwise documented below in the visit note. 

## 2016-03-16 NOTE — Progress Notes (Signed)
Subjective:     Patient ID: Aaron Wilkerson, male   DOB: [birth year], 46 y.o.   MRN: [id number]  HPI Patient seen for hospital follow-up. He was admitted on 1-17 and discharged week later with lower GI bleed. He had bright red blood per rectum and initially location was undetermined. He had some dizziness and mild lower abdominal pain with nausea and syncopal episode prior to going to the hospital. He fell and hit his head in the bathroom. CT of the head was unremarkable. Patient was given fluid resuscitation. He ended up getting EGD and colonoscopy. Colonoscopy showed significant diverticulosis distal ascending colon which was felt to be the site of bleeding. Upper endoscopy showed normal esophagus with erosive gastropathy and erythematous duodenum but no definite ulcer. He had been using some nonsteroidals. He also relates that until 2 months prior to admission he been abusing alcohol heavily (beer). He was seen at outpatient rehabilitation facility. H. pylori testing negative. Patient discharged on Protonix. He's taking iron.  Patient apparently had second GI bleed in the hospital and follow-up hemoglobin 6.9 and received 2 units packed red blood cells. His hemoglobin had been 15.9 back in September 2017.  Overall improved. No abdominal pain. No bloody stools. No nausea. Appetite is good. He had some mild vertigo in the hospital but has had none since then.  He had elevated blood pressures after his blood pressure stabilized and was placed on amlodipine 2.5 mg during hospitalization. Not monitoring blood pressures at home. His first date out of work was 1-17 and has not gone back yet. He brings in a stack of papers to be completed for FMLA.  Past Medical History:  Diagnosis Date  . Alcohol use disorder, mild, in sustained remission 03/08/2016   He quit after 20 years - 12 beers per day.  Was seen and treated in Rehabilitation facility.   Past Surgical History:  Procedure Laterality Date  .  COLONOSCOPY N/A 03/04/2016   Procedure: COLONOSCOPY;  Surgeon: Ruffin Frederick, MD;  Location: [facility];  Service: Gastroenterology;  Laterality: N/A;  . ESOPHAGOGASTRODUODENOSCOPY N/A 03/04/2016   Procedure: ESOPHAGOGASTRODUODENOSCOPY (EGD);  Surgeon: Ruffin Frederick, MD;  Location: [facility];  Service: Gastroenterology;  Laterality: N/A;  . KNEE SURGERY  2000    reports that he has never smoked. His smokeless tobacco use includes Chew. He reports that he drinks about 100.8 oz of alcohol per week . He reports that he does not use drugs. family history includes Heart disease in his maternal grandfather and maternal grandmother; Hyperlipidemia in his father; Hypertension in his father and mother. No Known Allergies   Review of Systems  Constitutional: Negative for fatigue and unexpected weight change.  Eyes: Negative for visual disturbance.  Respiratory: Negative for cough, chest tightness and shortness of breath.   Cardiovascular: Negative for chest pain, palpitations and leg swelling.  Gastrointestinal: Negative for abdominal pain, constipation, nausea and vomiting.  Neurological: Negative for dizziness, syncope, weakness, light-headedness and headaches.       Objective:   Physical Exam  Constitutional: He is oriented to person, place, and time. He appears well-developed and well-nourished.  HENT:  Right Ear: External ear normal.  Left Ear: External ear normal.  Mouth/Throat: Oropharynx is clear and moist.  Eyes: Pupils are equal, round, and reactive to light.  Neck: Neck supple. No thyromegaly present.  Cardiovascular: Normal rate and regular rhythm.   Pulmonary/Chest: Effort normal and breath sounds normal. No respiratory distress. He has no wheezes. He has no rales.  Musculoskeletal: He exhibits no edema.  Neurological: He is alert and oriented to person, place, and time.       Assessment:     #1 recent lower GI bleed secondary to diverticulosis  #2  history of gastropathy related to NSAID use with negative H. Pylori  #3 passes history of alcohol abuse  #4 hypertension which is a new diagnosis at the end of his hospitalization    Plan:     -recommend to 4 months of Protonix then should be able to taper off hopefully -Avoid any alcohol or nonsteroidals -Recheck CBC and basic metabolic panel -Continue amlodipine 2.5 mg daily -Agreed to one refill of clonazepam 0.5 mg. He is trying to taper this off as well. -Follow-up immediately for any recurrent GI bleeding or dizziness  Kristian CoveyBruce W Lenita Peregrina MD [facility]

## 2016-04-14 ENCOUNTER — Ambulatory Visit (INDEPENDENT_AMBULATORY_CARE_PROVIDER_SITE_OTHER): Payer: BLUE CROSS/BLUE SHIELD | Admitting: Family Medicine

## 2016-04-14 ENCOUNTER — Encounter: Payer: Self-pay | Admitting: Family Medicine

## 2016-04-14 VITALS — BP 150/100 | HR 91 | Ht 70.0 in | Wt 206.9 lb

## 2016-04-14 DIAGNOSIS — F1010 Alcohol abuse, uncomplicated: Secondary | ICD-10-CM

## 2016-04-14 DIAGNOSIS — F1011 Alcohol abuse, in remission: Secondary | ICD-10-CM

## 2016-04-14 DIAGNOSIS — I10 Essential (primary) hypertension: Secondary | ICD-10-CM

## 2016-04-14 DIAGNOSIS — D649 Anemia, unspecified: Secondary | ICD-10-CM

## 2016-04-14 DIAGNOSIS — F419 Anxiety disorder, unspecified: Secondary | ICD-10-CM

## 2016-04-14 LAB — CBC WITH DIFFERENTIAL/PLATELET
Basophils Absolute: 0 10*3/uL (ref 0.0–0.1)
Basophils Relative: 0.8 % (ref 0.0–3.0)
Eosinophils Absolute: 0.1 10*3/uL (ref 0.0–0.7)
Eosinophils Relative: 1.2 % (ref 0.0–5.0)
HCT: 36.8 % — ABNORMAL LOW (ref 39.0–52.0)
Hemoglobin: 12.6 g/dL — ABNORMAL LOW (ref 13.0–17.0)
Lymphocytes Relative: 35.6 % (ref 12.0–46.0)
Lymphs Abs: 1.8 10*3/uL (ref 0.7–4.0)
MCHC: 34.4 g/dL (ref 30.0–36.0)
MCV: 91.7 fl (ref 78.0–100.0)
Monocytes Absolute: 0.4 10*3/uL (ref 0.1–1.0)
Monocytes Relative: 7.5 % (ref 3.0–12.0)
Neutro Abs: 2.8 10*3/uL (ref 1.4–7.7)
Neutrophils Relative %: 54.9 % (ref 43.0–77.0)
Platelets: 149 10*3/uL — ABNORMAL LOW (ref 150.0–400.0)
RBC: 4.01 Mil/uL — ABNORMAL LOW (ref 4.22–5.81)
RDW: 13.3 % (ref 11.5–15.5)
WBC: 5.2 10*3/uL (ref 4.0–10.5)

## 2016-04-14 MED ORDER — CLONAZEPAM 0.5 MG PO TABS: 0.5000 mg | ORAL_TABLET | Freq: Two times a day (BID) | ORAL | 0 refills | Status: DC | PRN

## 2016-04-14 MED ORDER — VALSARTAN 80 MG PO TABS: 80.0000 mg | ORAL_TABLET | Freq: Every day | ORAL | 5 refills | Status: DC

## 2016-04-14 NOTE — Progress Notes (Signed)
Subjective:     Patient ID: Aaron Wilkerson, male   DOB: December 30, 1970, 46 y.o.   MRN: [id number]  HPI Patient seen for follow-up regarding recent hospitalization for diverticulosis bleed. Had no evidence for recurrent bleed since then. He had a couple episodes of extreme anxiety symptoms when out in public.  He has some fear of recurrent LGI bleed episode.   His last hemoglobin was 11.3. Taking iron once daily. No abdominal pain. No bloody stools. No orthostatic symptoms.  Elevated blood pressure during recent hospitalization and started on amlodipine 2.5 mg daily. He ran out of prescription over the past couple weeks and never got this refilled. He also completed his prescription of Protonix and has not started back on any acid suppression. No abdominal pain. No alcohol use. No nonsteroidal use.  Past Medical History:  Diagnosis Date  . Alcohol use disorder, mild, in sustained remission 03/08/2016   He quit after 20 years - 12 beers per day.  Was seen and treated in Rehabilitation facility.   Past Surgical History:  Procedure Laterality Date  . COLONOSCOPY N/A 03/04/2016   Procedure: COLONOSCOPY;  Surgeon: Ruffin FrederickSteven Paul Armbruster, MD;  Location: [facility];  Service: Gastroenterology;  Laterality: N/A;  . ESOPHAGOGASTRODUODENOSCOPY N/A 03/04/2016   Procedure: ESOPHAGOGASTRODUODENOSCOPY (EGD);  Surgeon: Ruffin FrederickSteven Paul Armbruster, MD;  Location: [facility];  Service: Gastroenterology;  Laterality: N/A;  . KNEE SURGERY  2000    reports that he has never smoked. His smokeless tobacco use includes Chew. He reports that he drinks about 100.8 oz of alcohol per week . He reports that he does not use drugs. family history includes Heart disease in his maternal grandfather and maternal grandmother; Hyperlipidemia in his father; Hypertension in his father and mother. No Known Allergies   Review of Systems  Constitutional: Negative for fatigue and unexpected weight change.  Eyes: Negative for visual  disturbance.  Respiratory: Negative for cough, chest tightness and shortness of breath.   Cardiovascular: Negative for chest pain, palpitations and leg swelling.  Endocrine: Negative for polydipsia and polyuria.  Neurological: Negative for dizziness, syncope, weakness, light-headedness and headaches.  Psychiatric/Behavioral: The patient is nervous/anxious.        Objective:   Physical Exam  Constitutional: He is oriented to person, place, and time. He appears well-developed and well-nourished.  Cardiovascular: Normal rate and regular rhythm.  Exam reveals no gallop.   Pulmonary/Chest: Effort normal and breath sounds normal. No respiratory distress. He has no wheezes. He has no rales.  Neurological: He is alert and oriented to person, place, and time. No cranial nerve deficit.  Psychiatric: He has a normal mood and affect. His behavior is normal. Judgment and thought content normal.       Assessment:     #1 recent diverticulosis bleed with anemia  #2 hypertension currently untreated  #3 past history of alcohol abuse  #4 anxiety symptoms    Plan:     -Recheck CBC and hopefully can stop if hemoglobin back to normal -Start valsartan and 80 mg once daily and reassess blood pressure in 3-4 weeks -Recommendation for high fiber diet -We discussed nonpharmacologic issues with managing anxiety. We gave him name of our clinical psychologist -Agreed to one refill only of clonazepam to use very sparingly for severe anxiety symptoms. We do not plan to maintain this long-term because of his past history of alcohol abuse  Aaron CoveyBruce W Burchette MD [facility]

## 2016-04-14 NOTE — Progress Notes (Signed)
Pre visit review using our clinic review tool, if applicable. No additional management support is needed unless otherwise documented below in the visit note. 

## 2016-05-12 ENCOUNTER — Encounter: Payer: Self-pay | Admitting: Family Medicine

## 2016-05-12 ENCOUNTER — Ambulatory Visit (INDEPENDENT_AMBULATORY_CARE_PROVIDER_SITE_OTHER): Payer: BLUE CROSS/BLUE SHIELD | Admitting: Family Medicine

## 2016-05-12 VITALS — BP 118/80 | HR 93 | Ht 70.0 in | Wt 203.2 lb

## 2016-05-12 DIAGNOSIS — D62 Acute posthemorrhagic anemia: Secondary | ICD-10-CM

## 2016-05-12 DIAGNOSIS — D649 Anemia, unspecified: Secondary | ICD-10-CM | POA: Diagnosis not present

## 2016-05-12 DIAGNOSIS — F411 Generalized anxiety disorder: Secondary | ICD-10-CM | POA: Diagnosis not present

## 2016-05-12 DIAGNOSIS — F419 Anxiety disorder, unspecified: Secondary | ICD-10-CM

## 2016-05-12 DIAGNOSIS — I10 Essential (primary) hypertension: Secondary | ICD-10-CM

## 2016-05-12 DIAGNOSIS — K5731 Diverticulosis of large intestine without perforation or abscess with bleeding: Secondary | ICD-10-CM

## 2016-05-12 LAB — CBC WITH DIFFERENTIAL/PLATELET
Basophils Absolute: 0 10*3/uL (ref 0.0–0.1)
Basophils Relative: 0.6 % (ref 0.0–3.0)
Eosinophils Absolute: 0.1 10*3/uL (ref 0.0–0.7)
Eosinophils Relative: 1.8 % (ref 0.0–5.0)
HCT: 39.5 % (ref 39.0–52.0)
Hemoglobin: 13.2 g/dL (ref 13.0–17.0)
Lymphocytes Relative: 30.8 % (ref 12.0–46.0)
Lymphs Abs: 2 10*3/uL (ref 0.7–4.0)
MCHC: 33.4 g/dL (ref 30.0–36.0)
MCV: 91 fl (ref 78.0–100.0)
Monocytes Absolute: 0.7 10*3/uL (ref 0.1–1.0)
Monocytes Relative: 10.9 % (ref 3.0–12.0)
Neutro Abs: 3.6 10*3/uL (ref 1.4–7.7)
Neutrophils Relative %: 55.9 % (ref 43.0–77.0)
Platelets: 164 10*3/uL (ref 150.0–400.0)
RBC: 4.34 Mil/uL (ref 4.22–5.81)
RDW: 13.7 % (ref 11.5–15.5)
WBC: 6.5 10*3/uL (ref 4.0–10.5)

## 2016-05-12 MED ORDER — SERTRALINE HCL 50 MG PO TABS: 50.0000 mg | ORAL_TABLET | Freq: Every day | ORAL | 3 refills | Status: DC

## 2016-05-12 MED ORDER — CLONAZEPAM 0.5 MG PO TABS: 0.5000 mg | ORAL_TABLET | Freq: Two times a day (BID) | ORAL | 0 refills | Status: DC | PRN

## 2016-05-12 NOTE — Progress Notes (Signed)
Subjective:     Patient ID: Aaron Wilkerson, male   DOB: 1970-10-28, 46 y.o.   MRN: [id number]  HPI Patient here for follow-up regarding several items:  Hypertension. We started valsartan and last visit and blood pressure greatly improved. No headaches. No dizziness. Denies any side effects.  Past history of alcohol abuse. He's been totally absent now for several weeks following recent GI bleed for diverticulosis. He has anemia related to that with recent hemoglobin improving at 12.6. Has been on iron but has had some constipation issues. Would like to discontinue if possible.  He has anxiety and a lot of this is anxiety regarding going into public places. He has noted his anxiety being worse after he stopped drinking alcohol. He's been taking some Klonopin low dosage when necessary for severe anxiety symptoms and we have recommended against long-term use.  Past Medical History:  Diagnosis Date  . Alcohol use disorder, mild, in sustained remission 03/08/2016   He quit after 20 years - 12 beers per day.  Was seen and treated in Rehabilitation facility.   Past Surgical History:  Procedure Laterality Date  . COLONOSCOPY N/A 03/04/2016   Procedure: COLONOSCOPY;  Surgeon: Ruffin FrederickSteven Paul Armbruster, MD;  Location: [facility];  Service: Gastroenterology;  Laterality: N/A;  . ESOPHAGOGASTRODUODENOSCOPY N/A 03/04/2016   Procedure: ESOPHAGOGASTRODUODENOSCOPY (EGD);  Surgeon: Ruffin FrederickSteven Paul Armbruster, MD;  Location: [facility];  Service: Gastroenterology;  Laterality: N/A;  . KNEE SURGERY  2000    reports that he has never smoked. His smokeless tobacco use includes Chew. He reports that he drinks about 100.8 oz of alcohol per week . He reports that he does not use drugs. family history includes Heart disease in his maternal grandfather and maternal grandmother; Hyperlipidemia in his father; Hypertension in his father and mother. No Known Allergies     Review of Systems  Constitutional: Negative for  fatigue and unexpected weight change.  Eyes: Negative for visual disturbance.  Respiratory: Negative for cough, chest tightness and shortness of breath.   Cardiovascular: Negative for chest pain, palpitations and leg swelling.  Gastrointestinal: Positive for constipation.  Neurological: Negative for dizziness, syncope, weakness, light-headedness and headaches.       Objective:   Physical Exam  Constitutional: He is oriented to person, place, and time. He appears well-developed and well-nourished.  HENT:  Right Ear: External ear normal.  Left Ear: External ear normal.  Mouth/Throat: Oropharynx is clear and moist.  Eyes: Pupils are equal, round, and reactive to light.  Neck: Neck supple. No thyromegaly present.  Cardiovascular: Normal rate and regular rhythm.   Pulmonary/Chest: Effort normal and breath sounds normal. No respiratory distress. He has no wheezes. He has no rales.  Musculoskeletal: He exhibits no edema.  Neurological: He is alert and oriented to person, place, and time.       Assessment:     #1 hypertension greatly improved-on Valsartan.  #2 recent diverticulosis bleed with resulting anemia  #3 recent constipation probably related to iron supplementation  #4 anxiety. Some of this certainly may be related alcohol abstinence as he had abuse for several years. He may have component of social anxiety as well    Plan:     -Continue valsartan. -Recheck CBC -Discontinue iron if hemoglobin further improved -Start sertraline 50 mg once daily -Wrote for very limited Klonopin 0.5 mg #15 one every 12 hours prn for severe anxiety but no further refills. He will not be taking this long-term -consider behavioral health intervention if he continues to  have difficulty managing anxiety symptoms.  Kristian Covey MD [facility] at [facility]

## 2016-05-12 NOTE — Progress Notes (Signed)
Pre visit review using our clinic review tool, if applicable. No additional management support is needed unless otherwise documented below in the visit note. 

## 2016-07-19 NOTE — Anesthesia Postprocedure Evaluation (Signed)
Anesthesia Post Note  Patient: Aaron Wilkerson  Procedure(s) Performed: Procedure(s) (LRB): COLONOSCOPY (N/A) ESOPHAGOGASTRODUODENOSCOPY (EGD) (N/A)     Anesthesia Post Evaluation  Last Vitals:  Vitals:   03/10/16 0550 03/10/16 1316  BP: 132/84 (!) 149/95  Pulse: 74 77  Resp: 20 19  Temp: 36.9 C 36.8 C    Last Pain:  Vitals:   03/10/16 1316  TempSrc: Oral  PainSc:                  Danilo Cappiello S

## 2016-07-19 NOTE — Addendum Note (Signed)
Addendum  created 07/19/16 1012 by Paysley Poplar, MD   Sign clinical note    

## 2016-08-03 ENCOUNTER — Ambulatory Visit (INDEPENDENT_AMBULATORY_CARE_PROVIDER_SITE_OTHER): Payer: BLUE CROSS/BLUE SHIELD | Admitting: Family Medicine

## 2016-08-03 ENCOUNTER — Encounter: Payer: Self-pay | Admitting: Family Medicine

## 2016-08-03 VITALS — BP 130/84 | HR 77 | Temp 98.5°F | Wt 190.1 lb

## 2016-08-03 DIAGNOSIS — F411 Generalized anxiety disorder: Secondary | ICD-10-CM | POA: Diagnosis not present

## 2016-08-03 DIAGNOSIS — H6122 Impacted cerumen, left ear: Secondary | ICD-10-CM | POA: Diagnosis not present

## 2016-08-03 MED ORDER — SERTRALINE HCL 100 MG PO TABS: 100.0000 mg | ORAL_TABLET | Freq: Every day | ORAL | 5 refills | Status: DC

## 2016-08-03 NOTE — Progress Notes (Addendum)
Subjective:     Patient ID: Aaron Wilkerson, male   DOB: 03/07/1970, 46 y.o.   MRN: [id number]  HPI Patient here to discuss anxiety symptoms. He has history of diverticulosis bleed several months ago has been stable since then. He has been totally abstinent of alcohol now for 3 months with prior history of excessive abuse. He has some next anxiety was suspected social anxiety and possibly panic attacks. We started sertraline 50 mg daily and has definitely noticed some improvement - though anxiety symptoms have been worsening over the past few weeks. Denies specific stressors. Denies any other illicit drug use.  He describes acute episodes of anxiety that occur in various settings and can last frequently 15 or 20 minutes where he feels very anxious and some numbness as tremors and occasionally shortness of breath. No chest pain.  Past Medical History:  Diagnosis Date  . Alcohol use disorder, mild, in sustained remission 03/08/2016   He quit after 20 years - 12 beers per day.  Was seen and treated in Rehabilitation facility.   Past Surgical History:  Procedure Laterality Date  . COLONOSCOPY N/A 03/04/2016   Procedure: COLONOSCOPY;  Surgeon: Ruffin FrederickSteven Paul Armbruster, MD;  Location: [facility];  Service: Gastroenterology;  Laterality: N/A;  . ESOPHAGOGASTRODUODENOSCOPY N/A 03/04/2016   Procedure: ESOPHAGOGASTRODUODENOSCOPY (EGD);  Surgeon: Ruffin FrederickSteven Paul Armbruster, MD;  Location: [facility];  Service: Gastroenterology;  Laterality: N/A;  . KNEE SURGERY  2000    reports that he has never smoked. His smokeless tobacco use includes Chew. He reports that he drinks about 100.8 oz of alcohol per week . He reports that he does not use drugs. family history includes Heart disease in his maternal grandfather and maternal grandmother; Hyperlipidemia in his father; Hypertension in his father and mother. No Known Allergies   Review of Systems  Constitutional: Negative for appetite change and unexpected weight  change.  Respiratory: Negative for shortness of breath.   Cardiovascular: Negative for chest pain.  Gastrointestinal: Negative for abdominal pain.  Psychiatric/Behavioral: Negative for agitation, confusion, dysphoric mood and suicidal ideas. The patient is nervous/anxious.        Objective:   Physical Exam  Constitutional: He appears well-developed and well-nourished.  HENT:  Right ear canal clear. Left ear canal impacted with cerumen  Cardiovascular: Normal rate and regular rhythm.   Pulmonary/Chest: Effort normal and breath sounds normal. No respiratory distress. He has no wheezes. He has no rales.  Musculoskeletal: He exhibits no edema.  Psychiatric: He has a normal mood and affect. His behavior is normal. Judgment and thought content normal.       Assessment:     #1 Anxiety-probable social anxiety and may have some panic disorder as well  #2 cerumen impaction left ear canal    Plan:     -Increase sertraline to 100 mg daily -Touch base in 3 weeks if anxiety symptoms not improving -Reiterated importance of avoiding regular use of benzodiazepines, especially with his prior history of alcohol abuse. -Used curette and removed cerumen from left canal in entirety without difficulty. Eardrum appears normal  Kristian CoveyBruce W Samon Dishner MD [facility]

## 2016-08-03 NOTE — Patient Instructions (Signed)
Let me know if anxiety symptoms not improved with titration of Zoloft in about 3 weeks.

## 2016-09-23 ENCOUNTER — Telehealth: Payer: Self-pay | Admitting: Family Medicine

## 2016-09-23 NOTE — Telephone Encounter (Signed)
Pt state that his valsartan has been recalled and would like to see what Dr. Caryl NeverBurchette would like for him to do.  Pharm:  [facility] [address]

## 2016-09-24 MED ORDER — LOSARTAN POTASSIUM 50 MG PO TABS: 50.0000 mg | ORAL_TABLET | Freq: Every day | ORAL | 1 refills | Status: DC

## 2016-09-24 NOTE — Telephone Encounter (Signed)
Rx done and I left a detailed message with this information at the pts cell number.   

## 2016-09-24 NOTE — Telephone Encounter (Signed)
Change to losartan 50 mg once daily

## 2016-12-21 DIAGNOSIS — H903 Sensorineural hearing loss, bilateral: Secondary | ICD-10-CM | POA: Diagnosis not present

## 2016-12-25 ENCOUNTER — Encounter (HOSPITAL_COMMUNITY): Payer: Self-pay

## 2016-12-25 ENCOUNTER — Emergency Department (HOSPITAL_COMMUNITY)
Admission: EM | Admit: 2016-12-25 | Discharge: 2016-12-26 | Disposition: A | Discharge status: Home / Self Care | Payer: BLUE CROSS/BLUE SHIELD | Attending: Emergency Medicine | Admitting: Emergency Medicine

## 2016-12-25 DIAGNOSIS — Y999 Unspecified external cause status: Secondary | ICD-10-CM | POA: Insufficient documentation

## 2016-12-25 DIAGNOSIS — Y929 Unspecified place or not applicable: Secondary | ICD-10-CM | POA: Diagnosis not present

## 2016-12-25 DIAGNOSIS — I10 Essential (primary) hypertension: Secondary | ICD-10-CM | POA: Diagnosis not present

## 2016-12-25 DIAGNOSIS — T782XXA Anaphylactic shock, unspecified, initial encounter: Secondary | ICD-10-CM

## 2016-12-25 DIAGNOSIS — T7840XA Allergy, unspecified, initial encounter: Secondary | ICD-10-CM | POA: Diagnosis not present

## 2016-12-25 DIAGNOSIS — Y939 Activity, unspecified: Secondary | ICD-10-CM | POA: Diagnosis not present

## 2016-12-25 DIAGNOSIS — Z79899 Other long term (current) drug therapy: Secondary | ICD-10-CM | POA: Insufficient documentation

## 2016-12-25 DIAGNOSIS — R0602 Shortness of breath: Secondary | ICD-10-CM | POA: Diagnosis not present

## 2016-12-25 DIAGNOSIS — R21 Rash and other nonspecific skin eruption: Secondary | ICD-10-CM | POA: Diagnosis present

## 2016-12-25 LAB — BASIC METABOLIC PANEL
Anion gap: 8 (ref 5–15)
BUN: 15 mg/dL (ref 6–20)
CO2: 25 mmol/L (ref 22–32)
Calcium: 9.4 mg/dL (ref 8.9–10.3)
Chloride: 102 mmol/L (ref 101–111)
Creatinine, Ser: 0.81 mg/dL (ref 0.61–1.24)
GFR calc Af Amer: >60 mL/min (ref >60)
GFR calc non Af Amer: >60 mL/min (ref >60)
Glucose, Bld: 130 mg/dL — ABNORMAL HIGH (ref 65–99)
Potassium: 4.4 mmol/L (ref 3.5–5.1)
Sodium: 135 mmol/L (ref 135–145)

## 2016-12-25 LAB — CBC
HCT: 39.9 % (ref 39.0–52.0)
Hemoglobin: 13.6 g/dL (ref 13.0–17.0)
MCH: 31.2 pg (ref 26.0–34.0)
MCHC: 34.1 g/dL (ref 30.0–36.0)
MCV: 91.5 fL (ref 78.0–100.0)
Platelets: 129 10*3/uL — ABNORMAL LOW (ref 150–400)
RBC: 4.36 MIL/uL (ref 4.22–5.81)
RDW: 13.1 % (ref 11.5–15.5)
WBC: 8.8 10*3/uL (ref 4.0–10.5)

## 2016-12-25 MED ORDER — FAMOTIDINE IN NACL 20-0.9 MG/50ML-% IV SOLN
20.0000 mg | Freq: Once | INTRAVENOUS | Status: AC
  Administered 2016-12-25: 20 mg via INTRAVENOUS
  Filled 2016-12-25: qty 50

## 2016-12-25 MED ORDER — METHYLPREDNISOLONE SODIUM SUCC 125 MG IJ SOLR
125.0000 mg | Freq: Once | INTRAMUSCULAR | Status: AC
  Administered 2016-12-25: 125 mg via INTRAVENOUS
  Filled 2016-12-25: qty 2

## 2016-12-25 MED ORDER — EPINEPHRINE 0.3 MG/0.3ML IJ SOAJ
INTRAMUSCULAR | Status: AC
  Administered 2016-12-25: 0.3 mg
  Filled 2016-12-25: qty 0.3

## 2016-12-25 MED ORDER — DIPHENHYDRAMINE HCL 50 MG/ML IJ SOLN
25.0000 mg | Freq: Once | INTRAMUSCULAR | Status: AC
  Administered 2016-12-25: 25 mg via INTRAVENOUS
  Filled 2016-12-25: qty 1

## 2016-12-25 NOTE — ED Notes (Signed)
Bed: WA22 Expected date:  Expected time:  Means of arrival:  Comments: Triage 

## 2016-12-25 NOTE — ED Triage Notes (Signed)
States small rash last night and about one hour ago rash all over body with some shortness of breath. No new changes at home.

## 2016-12-25 NOTE — ED Notes (Signed)
Bed: [id number] Expected date:  Expected time:  Means of arrival:  Comments: Hold triage 1

## 2016-12-25 NOTE — ED Provider Notes (Signed)
[facility]-EMERGENCY DEPT Provider Note   CSN: [id number] Arrival date & time: 12/25/16  2231     History   Chief Complaint Chief Complaint  Patient presents with  . Allergic Reaction    HPI Aaron Wilkerson is a 46 y.o. male with a hx of alcohol abuse, anxiety, GI bleed, hypertension presents to the Emergency Department complaining of gradual, persistent, progressively worsening rash covering the entirety of his body onset yesterday around 3 PM but acutely worsening within the last 1.5 hours.  Patient reports yesterday he noted several red and itchy spots.  He states that tonight they became somewhat worse and suspected poison ivy he used a poison ivy rinse.  He states that within 1 hour of this the rash is covered his entire body and he was having some difficulty breathing.  He denies known contact with poison ivy.  Wife does report new laundry detergent at home which they began using it 6 days ago.  Patient denies nausea, diaphoresis, lightheadedness, feeling of his mouth or throat swelling.  He denies wheezing.  Patient states he has not attempted any treatments in the last 24 hours.  No Benadryl or other antihistamines were given.  He denies known allergies for history of anaphylaxis.   The history is provided by the patient and medical records. No language interpreter was used.    Past Medical History:  Diagnosis Date  . Alcohol use disorder, mild, in sustained remission 03/08/2016   He quit after 20 years - 12 beers per day.  Was seen and treated in Rehabilitation facility.    Patient Active Problem List   Diagnosis Date Noted  . Anxiety state 05/12/2016  . Alcohol use disorder, mild, in sustained remission 03/08/2016  . Dizziness   . Diverticulosis of colon with hemorrhage   . Acute blood loss anemia 03/04/2016  . Erosive gastropathy 03/04/2016  . Syncope 03/04/2016  . Rectal bleeding 03/03/2016  . GI bleeding 03/03/2016  . Premature ejaculation  06/25/2013  . Hypertension 05/19/2013    Past Surgical History:  Procedure Laterality Date  . KNEE SURGERY  2000       Home Medications    Prior to Admission medications   Medication Sig Start Date End Date Taking? Authorizing Provider  losartan (COZAAR) 50 MG tablet Take 1 tablet (50 mg total) by mouth daily. 09/24/16  Yes Burchette, Elberta Fortis, MD  sertraline (ZOLOFT) 100 MG tablet Take 1 tablet (100 mg total) by mouth daily. 08/03/16  Yes Burchette, Elberta Fortis, MD  diphenhydrAMINE (BENADRYL) 25 MG tablet Take 1 tablet (25 mg total) every 6 (six) hours as needed by mouth for itching (or rash). 12/26/16   Boss Danielsen, Dahlia Client, PA-C  EPINEPHrine (EPIPEN 2-PAK) 0.3 mg/0.3 mL IJ SOAJ injection Inject 0.3 mLs (0.3 mg total) once as needed into the muscle (for severe allergic reaction). CAll 911 immediately if you have to use this medicine 12/26/16   Valoree Agent, Dahlia Client, PA-C  famotidine (PEPCID) 20 MG tablet Take 1 tablet (20 mg total) 2 (two) times daily by mouth. 12/26/16   Sheba Whaling, Dahlia Client, PA-C  hydrocortisone 2.5 % lotion Apply 2 (two) times daily as needed topically. For itching or rash 12/26/16   Kyaira Trantham, Dahlia Client, PA-C  predniSONE (DELTASONE) 20 MG tablet Take 2 tablets (40 mg total) daily by mouth. 12/26/16   Kashtyn Jankowski, Dahlia Client, PA-C    Family History Family History  Problem Relation Age of Onset  . Hypertension Mother   . Hypertension Father   . Hyperlipidemia Father   .  Heart disease Maternal Grandmother   . Heart disease Maternal Grandfather     Social History Social History   Tobacco Use  . Smoking status: Never Smoker  . Smokeless tobacco: Current User    Types: Chew  Substance Use Topics  . Alcohol use: Yes    Alcohol/week: 100.8 oz    Types: 168 Cans of beer per week  . Drug use: No     Allergies   Patient has no known allergies.   Review of Systems Review of Systems  Constitutional: Negative for appetite change, diaphoresis, fatigue, fever and  unexpected weight change.  HENT: Negative for mouth sores.   Eyes: Negative for visual disturbance.  Respiratory: Positive for shortness of breath. Negative for cough, chest tightness and wheezing.   Cardiovascular: Negative for chest pain.  Gastrointestinal: Negative for abdominal pain, constipation, diarrhea, nausea and vomiting.  Endocrine: Negative for polydipsia, polyphagia and polyuria.  Genitourinary: Negative for dysuria, frequency, hematuria and urgency.  Musculoskeletal: Negative for back pain and neck stiffness.  Skin: Positive for rash.  Allergic/Immunologic: Negative for immunocompromised state.  Neurological: Negative for syncope, light-headedness and headaches.  Hematological: Does not bruise/bleed easily.  Psychiatric/Behavioral: Negative for sleep disturbance. The patient is not nervous/anxious.      Physical Exam Updated Vital Signs BP 126/77   Pulse 62   Temp 97.8 F (36.6 C) (Oral)   Resp 16   Ht 5\' 10"  (1.778 m)   Wt 83.9 kg (185 lb)   SpO2 100%   BMI 26.54 kg/m   Physical Exam  Constitutional: He is oriented to person, place, and time. He appears well-developed and well-nourished. No distress.  HENT:  Head: Normocephalic and atraumatic.  Right Ear: Tympanic membrane, external ear and ear canal normal.  Left Ear: Tympanic membrane, external ear and ear canal normal.  Nose: Nose normal. No mucosal edema or rhinorrhea.  Mouth/Throat: Uvula is midline. No uvula swelling. No oropharyngeal exudate, posterior oropharyngeal edema, posterior oropharyngeal erythema or tonsillar abscesses.  No swelling of the uvula or oropharynx  Eyes: Conjunctivae are normal.  Neck: Normal range of motion.  Patent airway No stridor; normal phonation Handling secretions without difficulty  Cardiovascular: Normal rate, normal heart sounds and intact distal pulses.  No murmur heard. Pulmonary/Chest: Effort normal and breath sounds normal. No stridor. Tachypnea ( Slightly) noted.  No respiratory distress. He has no wheezes.  No wheezes or rhonchi  Abdominal: Soft. Bowel sounds are normal. There is no tenderness.  Musculoskeletal: Normal range of motion. He exhibits no edema.  Neurological: He is alert and oriented to person, place, and time.  Skin: Skin is warm and dry. Rash noted. He is not diaphoretic.  Urticaria noted over the arms, legs, trunk, back, groin, buttocks Mild excoriations - no induration or fluctuance to indicate secondary infection  Psychiatric: His mood appears anxious.  Nursing note and vitals reviewed.    ED Treatments / Results  Labs (all labs ordered are listed, but only abnormal results are displayed) Labs Reviewed  CBC - Abnormal; Notable for the following components:      Result Value   Platelets 129 (*)    All other components within normal limits  BASIC METABOLIC PANEL - Abnormal; Notable for the following components:   Glucose, Bld 130 (*)    All other components within normal limits    Procedures Procedures (including critical care time)  Medications Ordered in ED Medications  EPINEPHrine (EPI-PEN) 0.3 mg/0.3 mL injection (0.3 mg  Given 12/25/16 2305)  diphenhydrAMINE (  BENADRYL) injection 25 mg (25 mg Intravenous Given 12/25/16 2325)  methylPREDNISolone sodium succinate (SOLU-MEDROL) 125 mg/2 mL injection 125 mg (125 mg Intravenous Given 12/25/16 2321)  famotidine (PEPCID) IVPB 20 mg premix (0 mg Intravenous Stopped 12/26/16 0000)     Initial Impression / Assessment and Plan / ED Course  I have reviewed the triage vital signs and the nursing notes.  Pertinent labs & imaging results that were available during my care of the patient were reviewed by me and considered in my medical decision making (see chart for details).  Clinical Course as of Dec 26 308  Wynelle LinkSun Dec 26, 2016  0017 She reports symptoms have improved significantly.  Rash is seeding.  Shortness of breath has resolved completely.  Breath sounds remain clear and  equal.  Tachypnea resolved.  [HM]  0120 Patient continues to be without shortness of breath.  Rash continues to improve.  [HM]    Clinical Course User Index [HM] Letizia Hook, Dahlia ClientHannah, [place] JerseyPA-C    Patient presents with allergic reaction and difficulty breathing.  Given epinephrine for anaphylaxis.  Symptoms improved significantly within several minutes.  Rash is almost completely resolved at this time.  No return of symptoms including no shortness of breath or return of rash.  Patient reports he feels well.  We will discharged home with a course of prednisone, Pepcid and Benadryl.  Patient also given EpiPen.  Discussed reasons to use this and reasons to return immediately to the emergency department.  Referral to allergist as patient is unsure the etiology of his reaction.  Final Clinical Impressions(s) / ED Diagnoses   Final diagnoses:  Allergic reaction, initial encounter  Anaphylaxis, initial encounter    ED Discharge Orders        Ordered    predniSONE (DELTASONE) 20 MG tablet  Daily     12/26/16 0302    famotidine (PEPCID) 20 MG tablet  2 times daily     12/26/16 0302    diphenhydrAMINE (BENADRYL) 25 MG tablet  Every 6 hours PRN     12/26/16 0302    EPINEPHrine (EPIPEN 2-PAK) 0.3 mg/0.3 mL IJ SOAJ injection  Once PRN     12/26/16 0302    hydrocortisone 2.5 % lotion  2 times daily PRN     12/26/16 0303       Quatisha Zylka, Boyd KerbsHannah, PA-C 12/26/16 0310    Raeford RazorKohut, Stephen, MD 12/28/16 1238

## 2016-12-26 MED ORDER — FAMOTIDINE 20 MG PO TABS: 20.0000 mg | ORAL_TABLET | Freq: Two times a day (BID) | ORAL | 0 refills | Status: DC

## 2016-12-26 MED ORDER — EPINEPHRINE 0.3 MG/0.3ML IJ SOAJ: 0.3000 mg | Freq: Once | INTRAMUSCULAR | 1 refills | Status: DC | PRN

## 2016-12-26 MED ORDER — HYDROCORTISONE 2.5 % EX LOTN: TOPICAL_LOTION | Freq: Two times a day (BID) | CUTANEOUS | 0 refills | Status: DC | PRN

## 2016-12-26 MED ORDER — PREDNISONE 20 MG PO TABS: 40.0000 mg | ORAL_TABLET | Freq: Every day | ORAL | 0 refills | Status: DC

## 2016-12-26 MED ORDER — DIPHENHYDRAMINE HCL 25 MG PO TABS: 25.0000 mg | ORAL_TABLET | Freq: Four times a day (QID) | ORAL | 0 refills | Status: DC | PRN

## 2016-12-26 NOTE — Discharge Instructions (Signed)
1. Medications: Prednisone, Benadryl, Pepcid, hydrocortisone lotion, Epi Pen, usual home medications 2. Treatment: rest, drink plenty of fluids, take medications as prescribed 3. Follow Up: Please followup with your primary doctor in 3 days for discussion of your diagnoses and further evaluation after today's visit; if you do not have a primary care doctor use the resource guide provided to find one; followup with dermatology as needed; Return to the ER for difficulty breathing, return of allergic reaction or other concerning symptoms

## 2017-02-03 ENCOUNTER — Telehealth: Payer: Self-pay | Admitting: Family Medicine

## 2017-02-03 NOTE — Telephone Encounter (Signed)
Copied from CRM [id number]. Topic: Quick Communication - Rx Refill/Question >> Feb 03, 2017 10:12 AM Oneal GroutSebastian, Jennifer S wrote: Has the patient contacted their pharmacy? Yes.     (Agent: If no, request that the patient contact the pharmacy for the refill.)   Preferred Pharmacy (with phone number or street name): [facility] [address]   Agent: Please be advised that RX refills may take up to 3 business days. We ask that you follow-up with your pharmacy. Requesting refill on sertraline (ZOLOFT) 100 MG tablet, has appt on 02/09/17

## 2017-02-04 MED ORDER — SERTRALINE HCL 100 MG PO TABS: 100.0000 mg | ORAL_TABLET | Freq: Every day | ORAL | 1 refills | Status: DC

## 2017-02-04 NOTE — Telephone Encounter (Signed)
Refill sent.

## 2017-02-09 ENCOUNTER — Ambulatory Visit: Payer: BLUE CROSS/BLUE SHIELD | Admitting: Family Medicine

## 2017-03-26 ENCOUNTER — Other Ambulatory Visit: Payer: Self-pay | Admitting: Family Medicine

## 2017-05-01 ENCOUNTER — Emergency Department (HOSPITAL_COMMUNITY)
Admission: EM | Admit: 2017-05-01 | Discharge: 2017-05-01 | Disposition: A | Discharge status: Home / Self Care | Payer: BLUE CROSS/BLUE SHIELD | Attending: Emergency Medicine | Admitting: Emergency Medicine

## 2017-05-01 ENCOUNTER — Emergency Department (HOSPITAL_COMMUNITY): Payer: BLUE CROSS/BLUE SHIELD

## 2017-05-01 ENCOUNTER — Encounter (HOSPITAL_COMMUNITY): Payer: Self-pay | Admitting: Emergency Medicine

## 2017-05-01 DIAGNOSIS — F17220 Nicotine dependence, chewing tobacco, uncomplicated: Secondary | ICD-10-CM | POA: Insufficient documentation

## 2017-05-01 DIAGNOSIS — Y999 Unspecified external cause status: Secondary | ICD-10-CM | POA: Diagnosis not present

## 2017-05-01 DIAGNOSIS — W298XXA Contact with other powered powered hand tools and household machinery, initial encounter: Secondary | ICD-10-CM | POA: Diagnosis not present

## 2017-05-01 DIAGNOSIS — Z79899 Other long term (current) drug therapy: Secondary | ICD-10-CM | POA: Insufficient documentation

## 2017-05-01 DIAGNOSIS — Y939 Activity, unspecified: Secondary | ICD-10-CM | POA: Diagnosis not present

## 2017-05-01 DIAGNOSIS — S61102A Unspecified open wound of left thumb with damage to nail, initial encounter: Secondary | ICD-10-CM | POA: Diagnosis not present

## 2017-05-01 DIAGNOSIS — Y929 Unspecified place or not applicable: Secondary | ICD-10-CM | POA: Insufficient documentation

## 2017-05-01 DIAGNOSIS — R030 Elevated blood-pressure reading, without diagnosis of hypertension: Secondary | ICD-10-CM | POA: Diagnosis not present

## 2017-05-01 DIAGNOSIS — I10 Essential (primary) hypertension: Secondary | ICD-10-CM | POA: Diagnosis not present

## 2017-05-01 DIAGNOSIS — S6992XA Unspecified injury of left wrist, hand and finger(s), initial encounter: Secondary | ICD-10-CM | POA: Diagnosis present

## 2017-05-01 DIAGNOSIS — S61012A Laceration without foreign body of left thumb without damage to nail, initial encounter: Secondary | ICD-10-CM

## 2017-05-01 DIAGNOSIS — M79645 Pain in left finger(s): Secondary | ICD-10-CM | POA: Diagnosis not present

## 2017-05-01 MED ORDER — LOSARTAN POTASSIUM 50 MG PO TABS
50.0000 mg | ORAL_TABLET | Freq: Once | ORAL | Status: AC
  Administered 2017-05-01: 50 mg via ORAL
  Filled 2017-05-01: qty 1

## 2017-05-01 MED ORDER — CEPHALEXIN 500 MG PO CAPS: 500.0000 mg | ORAL_CAPSULE | Freq: Four times a day (QID) | ORAL | 0 refills | Status: AC

## 2017-05-01 MED ORDER — LIDOCAINE HCL 1 % IJ SOLN
5.0000 mL | Freq: Once | INTRAMUSCULAR | Status: AC
  Administered 2017-05-01: 30 mL via INTRADERMAL

## 2017-05-01 MED ORDER — LIDOCAINE HCL (PF) 1 % IJ SOLN
INTRAMUSCULAR | Status: AC
  Administered 2017-05-01: 30 mL via INTRADERMAL
  Filled 2017-05-01: qty 30

## 2017-05-01 NOTE — ED Notes (Signed)
Bed: WTR8 Expected date:  Expected time:  Means of arrival:  Comments: 

## 2017-05-01 NOTE — ED Triage Notes (Signed)
Patient c/o laceration to left thumb yesterday from saw. [facility]en at [facility] and sent for further evaluation. Last tetanus shot within two years. Movement and sensation to thumb. Approx one inch laceration.

## 2017-05-01 NOTE — ED Provider Notes (Signed)
[facility]-EMERGENCY DEPT Provider Note   CSN: [id number] Arrival date & time: 05/01/17  1030     History   Chief Complaint Chief Complaint  Patient presents with  . Laceration    HPI Aaron Wilkerson is a 47 y.o. male.  HPI  Pt is a 47 y/o male who presents to the ED today c/o left thumb injury that occurred yesterday. States he was using a reciprocating saw and took the safety off when he crushed his left thumb in the machine. States that he did not actually cut his finger with the saw. However his thumb was caught in between the machine and this caused break in the skin. Rates pain 5/10 and is constant. Occurred suddenly. Denies any other injuries. Denies numbness/tingling to the thumb. Has full range of motion. Has not taken any medicine for the pain. Was seen at [facility] and sent here. Tetanus UTD.   Past Medical History:  Diagnosis Date  . Alcohol use disorder, mild, in sustained remission 03/08/2016   He quit after 20 years - 12 beers per day.  Was seen and treated in Rehabilitation facility.    Patient Active Problem List   Diagnosis Date Noted  . Anxiety state 05/12/2016  . Alcohol use disorder, mild, in sustained remission 03/08/2016  . Dizziness   . Diverticulosis of colon with hemorrhage   . Acute blood loss anemia 03/04/2016  . Erosive gastropathy 03/04/2016  . Syncope 03/04/2016  . Rectal bleeding 03/03/2016  . GI bleeding 03/03/2016  . Premature ejaculation 06/25/2013  . Hypertension 05/19/2013    Past Surgical History:  Procedure Laterality Date  . COLONOSCOPY N/A 03/04/2016   Procedure: COLONOSCOPY;  Surgeon: Ruffin Frederick, MD;  Location: [facility];  Service: Gastroenterology;  Laterality: N/A;  . ESOPHAGOGASTRODUODENOSCOPY N/A 03/04/2016   Procedure: ESOPHAGOGASTRODUODENOSCOPY (EGD);  Surgeon: Ruffin Frederick, MD;  Location: [facility];  Service: Gastroenterology;  Laterality: N/A;  . KNEE SURGERY  2000        Home Medications    Prior to Admission medications   Medication Sig Start Date End Date Taking? Authorizing Provider  cephALEXin (KEFLEX) 500 MG capsule Take 1 capsule (500 mg total) by mouth 4 (four) times daily for 7 days. 05/01/17 05/08/17  Amery Vandenbos S, PA-C  diphenhydrAMINE (BENADRYL) 25 MG tablet Take 1 tablet (25 mg total) every 6 (six) hours as needed by mouth for itching (or rash). 12/26/16   Muthersbaugh, Dahlia Client, PA-C  EPINEPHrine (EPIPEN 2-PAK) 0.3 mg/0.3 mL IJ SOAJ injection Inject 0.3 mLs (0.3 mg total) once as needed into the muscle (for severe allergic reaction). CAll 911 immediately if you have to use this medicine 12/26/16   Muthersbaugh, Dahlia Client, PA-C  famotidine (PEPCID) 20 MG tablet Take 1 tablet (20 mg total) 2 (two) times daily by mouth. 12/26/16   Muthersbaugh, Dahlia Client, PA-C  hydrocortisone 2.5 % lotion Apply 2 (two) times daily as needed topically. For itching or rash 12/26/16   Muthersbaugh, Dahlia Client, PA-C  losartan (COZAAR) 50 MG tablet TAKE 1 TABLET(50 MG) BY MOUTH DAILY 03/28/17   Burchette, Elberta Fortis, MD  predniSONE (DELTASONE) 20 MG tablet Take 2 tablets (40 mg total) daily by mouth. 12/26/16   Muthersbaugh, Dahlia Client, PA-C  sertraline (ZOLOFT) 100 MG tablet Take 1 tablet (100 mg total) by mouth daily. 02/04/17   Burchette, Elberta Fortis, MD    Family History Family History  Problem Relation Age of Onset  . Hypertension Mother   . Hypertension Father   .  Hyperlipidemia Father   . Heart disease Maternal Grandmother   . Heart disease Maternal Grandfather     Social History Social History   Tobacco Use  . Smoking status: Never Smoker  . Smokeless tobacco: Current User    Types: Chew  Substance Use Topics  . Alcohol use: Yes    Alcohol/week: 100.8 oz    Types: 168 Cans of beer per week  . Drug use: No     Allergies   Patient has no known allergies.   Review of Systems Review of Systems  Musculoskeletal:       Left thumb injury and pain  Skin:        Left thumb laceration  Neurological: Negative for numbness.     Physical Exam Updated Vital Signs BP (!) 141/93   Pulse 80   Temp 97.6 F (36.4 C) (Oral)   Resp 16   SpO2 99%   Physical Exam  Constitutional: He is oriented to person, place, and time. He appears well-developed and well-nourished. No distress.  Eyes: Conjunctivae are normal.  Cardiovascular: Normal rate.  Pulmonary/Chest: Effort normal.  Musculoskeletal:  Wound noted to the left thumb with skin flap that is curved and measures about 2.5 cm.  There is bruising noted underneath the nail, but no damage to naill There is an area of swelling to the palmar aspect of the distal thumb.  Sensation intact.  Brisk cap refill.  Full range of motion of the thumb at all joints with flexion and extension.  Neurological: He is alert and oriented to person, place, and time.  Skin: Skin is warm and dry. Capillary refill takes less than 2 seconds.     ED Treatments / Results  Labs (all labs ordered are listed, but only abnormal results are displayed) Labs Reviewed - No data to display  EKG  EKG Interpretation None       Radiology Dg Finger Thumb Left  Result Date: 05/01/2017 CLINICAL DATA:  Laceration.  Trauma. EXAM: LEFT THUMB 2+V COMPARISON:  None. FINDINGS: A linear radiodensity is identified along the volar aspect of the thumb soft tissues near the laceration, consistent with a foreign body. A smaller more proximal soft tissue focus of high attenuation could represent a second foreign body. No fractures. IMPRESSION: 1 or 2 foreign bodies in the subcutaneous soft tissues of the thumb near the laceration. Electronically Signed   By: Gerome Sam III M.D   On: 05/01/2017 12:52    Procedures Procedures (including critical care time)  Medications Ordered in ED Medications  losartan (COZAAR) tablet 50 mg (50 mg Oral Given 05/01/17 1326)  lidocaine (XYLOCAINE) 1 % (with pres) injection 5 mL (30 mLs Intradermal Given  by Other 05/01/17 1330)     Initial Impression / Assessment and Plan / ED Course  I have reviewed the triage vital signs and the nursing notes.  Pertinent labs & imaging results that were available during my care of the patient were reviewed by me and considered in my medical decision making (see chart for details).  Discussed pt presentation and exam findings with Dr. Rhunette Croft, who personally evaluated the patient and agrees that foreign bodies are located proximal to injury and that patient can be discharged with antibiotics and follow-up with hand surgery.  Final Clinical Impressions(s) / ED Diagnoses   Final diagnoses:  Laceration of left thumb, foreign body presence unspecified, nail damage status unspecified, initial encounter    Xray of thumb showed no bony involvement, but did note 1- (  possible)2 foreign bodies. Pressure irrigation performed. Wound explored and base of wound visualized in a bloodless field.  Foreign bodies were not noted on my exam and measurement of foreign bodies compared to x-ray revealed that suspected foreign bodies were located proximal to where injury occurred.  Laceration occurred > 8 hours prior to visit in ED and wound not appropriate for closure with sutures.  TDAP UTD.  Pt has no comorbidities to effect normal wound healing. Pt discharged with antibiotics.  Discussed wound care with patient and answered questions.  Patient was given referral to hand surgeon for further follow-up and they are to return to the ED sooner for signs of infection. Pt is hemodynamically stable with no complaints prior to dc.   ED Discharge Orders        Ordered    cephALEXin (KEFLEX) 500 MG capsule  4 times daily     05/01/17 [address]1421       Meir Elwood S, PA-C 05/01/17 2241    Derwood KaplanNanavati, Ankit, MD 05/02/17 [phone]

## 2017-05-01 NOTE — ED Notes (Signed)
Dressing applied to left thumb.

## 2017-05-01 NOTE — Discharge Instructions (Addendum)
You were given a prescription for antibiotics. Please take the antibiotic prescription fully.  Please follow the attached instructions to complete proper wound care.  I have prescribed a new medication for you today. It is important that when you pick the prescription up you discuss the potential interactions of this medication with other medications you are taking, including over the counter medications, with the pharmacists.   This new medication has potential side effects. Be sure to contact your primary care provider or return to the emergency department if you are experiencing new symptoms that you are unable to tolerate after starting the medication. You need to receive medical evaluation immediately if you start to experience blistering of the skin, rash, swelling, or difficulty breathing as these signs could indicate a more serious medication side effect.   Please call the hand surgeon, Dr. Jena GaussHaddix to schedule an appointment for follow-up.  You should also follow-up with your primary doctor within the next 3 days for evaluation of your injury.  Please return to the emergency room immediately if you experience any new or worsening symptoms or any symptoms that indicate worsening infection such as fevers, increased redness/swelling/pain, warmth, or drainage from the affected area.

## 2017-09-09 ENCOUNTER — Other Ambulatory Visit: Payer: Self-pay | Admitting: Family Medicine

## 2017-09-23 ENCOUNTER — Encounter: Payer: Self-pay | Admitting: Family Medicine

## 2017-09-23 ENCOUNTER — Ambulatory Visit: Payer: BLUE CROSS/BLUE SHIELD | Admitting: Family Medicine

## 2017-09-23 VITALS — BP 124/90 | HR 82 | Temp 97.3°F | Ht 70.0 in | Wt 209.1 lb

## 2017-09-23 DIAGNOSIS — I10 Essential (primary) hypertension: Secondary | ICD-10-CM

## 2017-09-23 DIAGNOSIS — F411 Generalized anxiety disorder: Secondary | ICD-10-CM

## 2017-09-23 MED ORDER — BUSPIRONE HCL 15 MG PO TABS: ORAL_TABLET | ORAL | 1 refills | Status: DC

## 2017-09-23 MED ORDER — LOSARTAN POTASSIUM 50 MG PO TABS: ORAL_TABLET | ORAL | 3 refills | Status: DC

## 2017-09-23 NOTE — Progress Notes (Signed)
  Subjective:     Patient ID: Aaron AlarJames L Kilfoyle, male   DOB: 1971/01/07, 47 y.o.   MRN: [id number]  HPI Patient seen for medication follow-up.  Hypertension change with losartan. He states he ran out of BP medication about a week ago. No headaches. No dizziness. No chest pains.  History of anxiety. Past history of alcohol abuse. About 18 months ago had diverticulosis bleed. No bleeding since then. He's been on sertraline only taking half tablet per day but is still having significant anxiety symptoms generally mostly in the early mornings. Almost no caffeine use. Denies alcohol use. Also has some tremor right upper history greater than left. He feels that his anxiety symptoms are more predominant than the tremor. Denies depression.  Denies any specific stressors other than general stress associated with work. No other life stressors at this time  Past Medical History:  Diagnosis Date  . Alcohol use disorder, mild, in sustained remission 03/08/2016   He quit after 20 years - 12 beers per day.  Was seen and treated in Rehabilitation facility.   Past Surgical History:  Procedure Laterality Date  . COLONOSCOPY N/A 03/04/2016   Procedure: COLONOSCOPY;  Surgeon: Ruffin FrederickSteven Paul Armbruster, MD;  Location: [facility];  Service: Gastroenterology;  Laterality: N/A;  . ESOPHAGOGASTRODUODENOSCOPY N/A 03/04/2016   Procedure: ESOPHAGOGASTRODUODENOSCOPY (EGD);  Surgeon: Ruffin FrederickSteven Paul Armbruster, MD;  [facility];  Service: Gastroenterology;  Laterality: N/A;  . KNEE SURGERY  2000    reports that he has never smoked. His smokeless tobacco use includes chew. He reports that he drinks about 168.0 standard drinks of alcohol per week. He reports that he does not use drugs. family history includes Heart disease in his maternal grandfather and maternal grandmother; Hyperlipidemia in his father; Hypertension in his father and mother. No Known Allergies   Review of Systems  Constitutional: Negative for  appetite change, fatigue and unexpected weight change.  Eyes: Negative for visual disturbance.  Respiratory: Negative for cough, chest tightness and shortness of breath.   Cardiovascular: Negative for chest pain, palpitations and leg swelling.  Neurological: Positive for tremors. Negative for dizziness, syncope, weakness, light-headedness and headaches.  Psychiatric/Behavioral: The patient is nervous/anxious.        Objective:   Physical Exam  Constitutional: He is oriented to person, place, and time. He appears well-developed and well-nourished.  HENT:  Right Ear: External ear normal.  Left Ear: External ear normal.  Mouth/Throat: Oropharynx is clear and moist.  Eyes: Pupils are equal, round, and reactive to light.  Neck: Neck supple. No thyromegaly present.  Cardiovascular: Normal rate and regular rhythm.  Pulmonary/Chest: Effort normal and breath sounds normal. No respiratory distress. He has no wheezes. He has no rales.  Musculoskeletal: He exhibits no edema.  Neurological: He is alert and oriented to person, place, and time.       Assessment:     #1 hypertension. Patient currently off medication  #2 chronic anxiety.    Plan:     -Refill losartan for once daily use -Discussed nonpharmacologic management of anxiety. We recommended adequate rest, continue to minimize caffeine use. Strict avoidance of alcohol, regular exercise -Consider trial of BuSpar 7.5 mg twice a day and reassess in 3-4 weeks -Avoid benzodiazepines with past history of drug abuse  Kristian CoveyBruce W Langford Carias MD [facility]

## 2017-10-24 ENCOUNTER — Encounter: Payer: Self-pay | Admitting: Family Medicine

## 2017-10-24 ENCOUNTER — Ambulatory Visit: Payer: BLUE CROSS/BLUE SHIELD | Admitting: Family Medicine

## 2017-10-24 VITALS — BP 120/84 | HR 75 | Temp 98.4°F | Wt 210.5 lb

## 2017-10-24 DIAGNOSIS — F411 Generalized anxiety disorder: Secondary | ICD-10-CM

## 2017-10-24 NOTE — Patient Instructions (Signed)

## 2017-10-24 NOTE — Progress Notes (Signed)
  Subjective:     Patient ID: Aaron Wilkerson, male   DOB: November 27, 1970, 47 y.o.   MRN: [id number]  HPI Here for follow-up anxiety. We started low-dose . He's been taking only one half tablet every morning and feels that has helped his anxiety tremendously. His anxiety tends to be worse early in the morning. Minimal caffeine use. Past history of alcohol abuse. No alcohol use currently.  Denies depression symptoms.   Past Medical History:  Diagnosis Date  . Alcohol use disorder, mild, in sustained remission 03/08/2016   He quit after 20 years - 12 beers per day.  Was seen and treated in Rehabilitation facility.   Past Surgical History:  Procedure Laterality Date  . COLONOSCOPY N/A 03/04/2016   Procedure: COLONOSCOPY;  Surgeon: Ruffin Frederick, MD;  Location: [facility];  Service: Gastroenterology;  Laterality: N/A;  . ESOPHAGOGASTRODUODENOSCOPY N/A 03/04/2016   Procedure: ESOPHAGOGASTRODUODENOSCOPY (EGD);  Surgeon: Ruffin Frederick, MD;  Location: [facility];  Service: Gastroenterology;  Laterality: N/A;  . KNEE SURGERY  2000    reports that he has never smoked. His smokeless tobacco use includes chew. He reports that he drinks about 168.0 standard drinks of alcohol per week. He reports that he does not use drugs. family history includes Heart disease in his maternal grandfather and maternal grandmother; Hyperlipidemia in his father; Hypertension in his father and mother. No Known Allergies   Review of Systems  Constitutional: Negative for appetite change and unexpected weight change.  Neurological: Negative for headaches.  Psychiatric/Behavioral: Negative for dysphoric mood.       Objective:   Physical Exam  Constitutional: He is oriented to person, place, and time. He appears well-developed and well-nourished.  Cardiovascular: Normal rate and regular rhythm.  Pulmonary/Chest: Effort normal and breath sounds normal.  Neurological: He is alert and oriented to person,  place, and time.  Psychiatric: He has a normal mood and affect. His behavior is normal.       Assessment:     Anxiety-improved with low-dose Buspar    Plan:     -continue to minimize caffeine intake -Continue low-dose BuSpar -Consider setting up physical at some point next year  Kristian Covey MD [facility]

## 2018-04-08 IMAGING — CT CT CTA ABD/PEL W/CM AND/OR W/O CM
2 of 9 series · 12 of 46 positions shown, 15 images · IV contrast (isovue)
Comparison: None.

CLINICAL DATA: 46-year-old male with a history of recurrent
hematochezia. Colonoscopy demonstrates diverticula, with clipping
performed

EXAM:
CTA ABDOMEN AND PELVIS WITH CONTRAST
TECHNIQUE: Multidetector CT imaging of the abdomen and pelvis was performed
using the standard protocol during bolus administration of
intravenous contrast. Multiplanar reconstructed images and MIPs were
obtained and reviewed to evaluate the vascular anatomy.
CONTRAST:  100 cc Isovue 370

[Series 5: axial venous · axial · portal-venous · 0.87mm/px · z∈[-385,+23]mm · 10 of 238 slices shown, 13 images]
[im 17/238  soft-tissue]
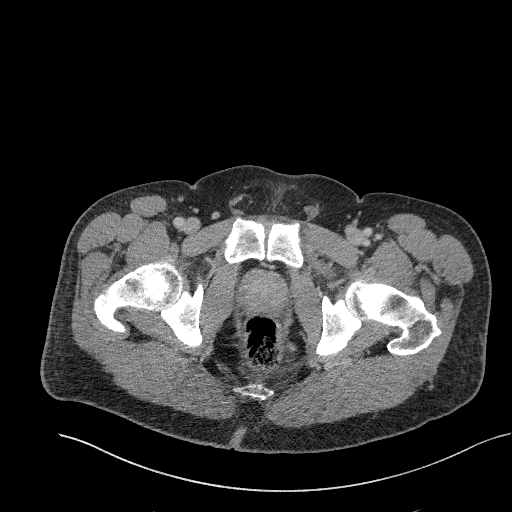
[im 17/238  bone]
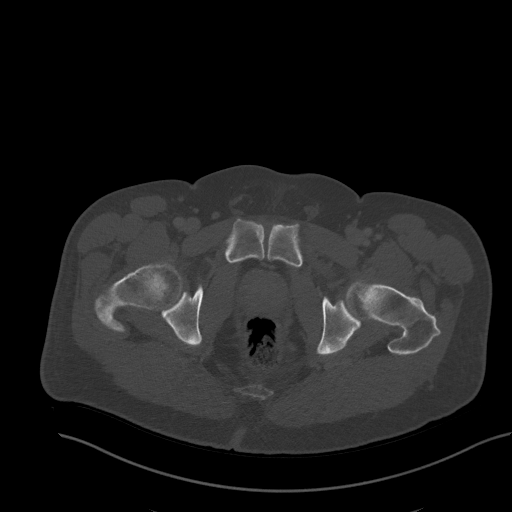
[im 51/238  soft-tissue]
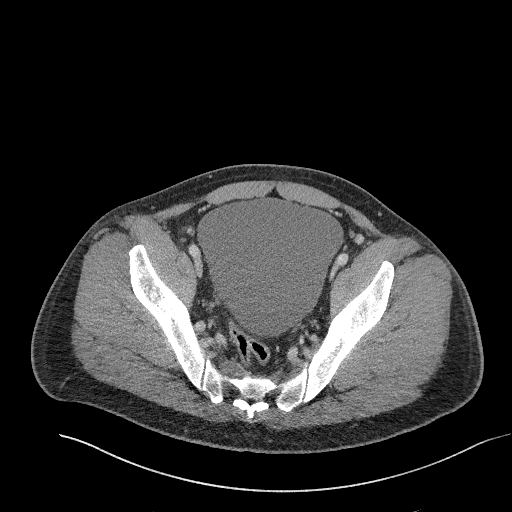
[im 85/238  soft-tissue]
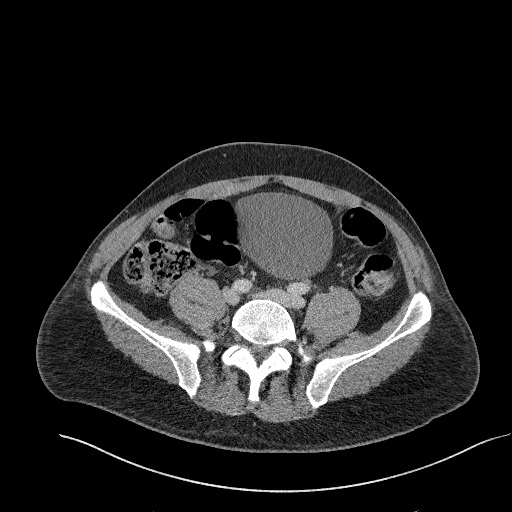
[im 102/238  soft-tissue]
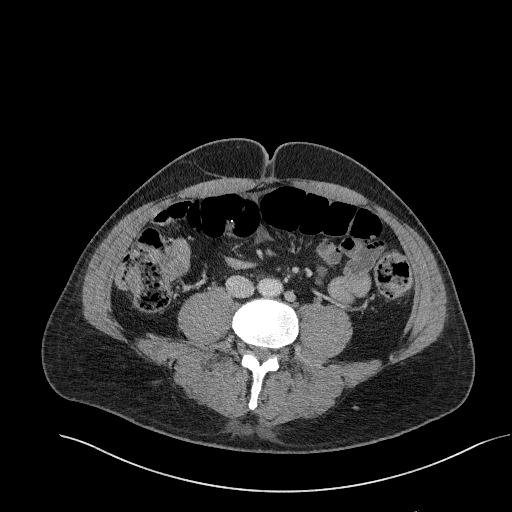
[im 136/238  soft-tissue]
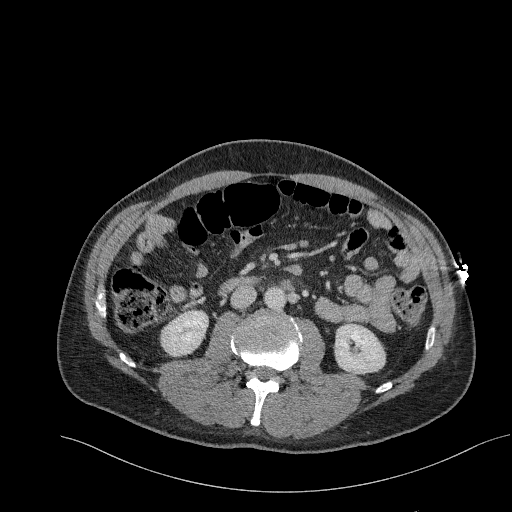
[im 153/238  soft-tissue]
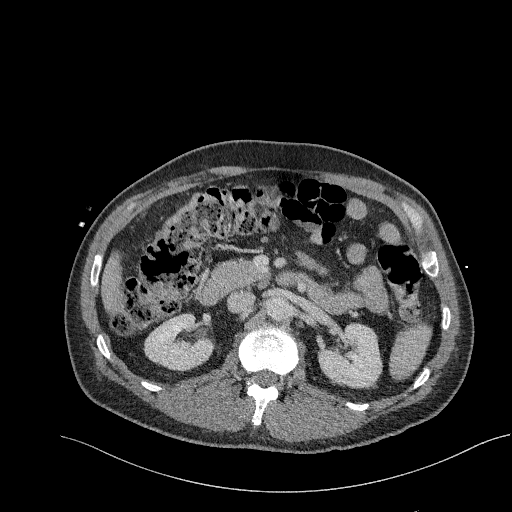
[im 170/238  lung]
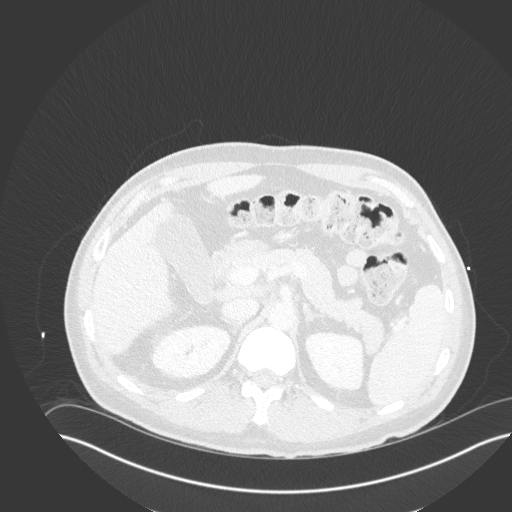
[im 187/238  soft-tissue]
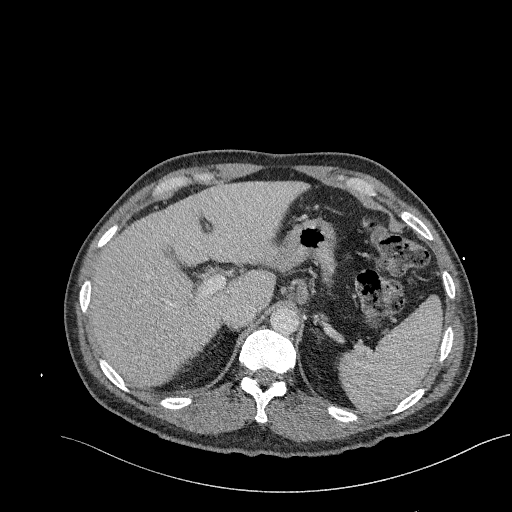
[im 187/238  lung]
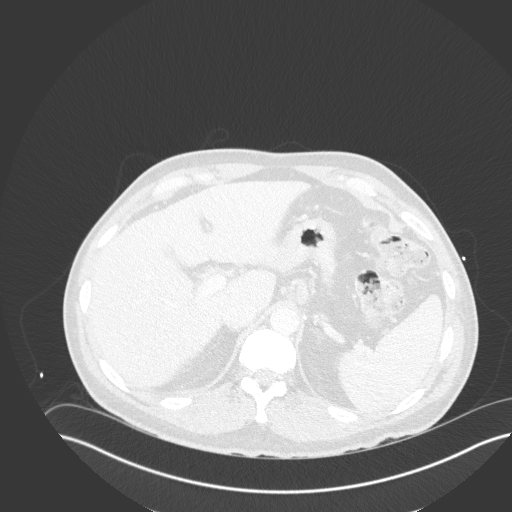
[im 204/238  lung]
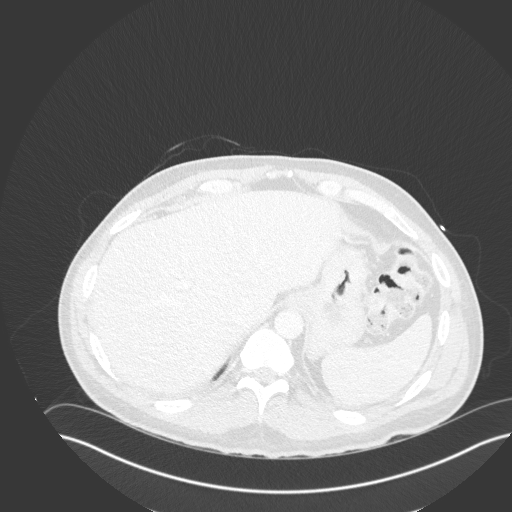
[im 221/238  soft-tissue]
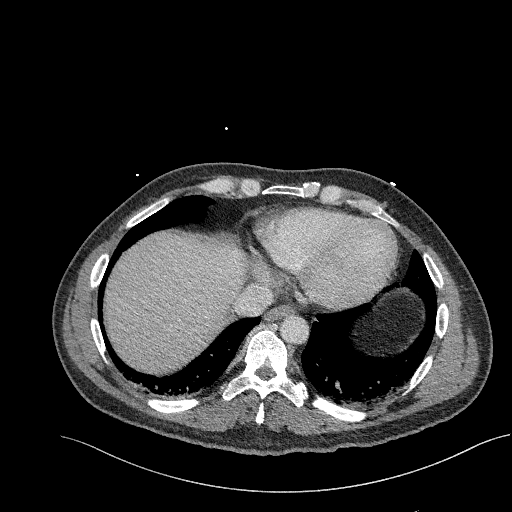
[im 221/238  lung]
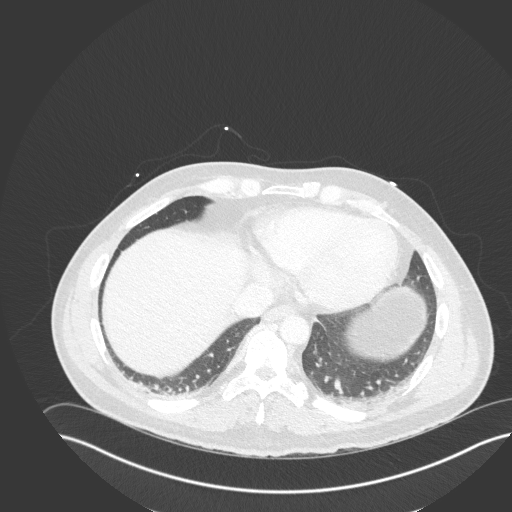

[Series 7: coronal mpr · coronal · 0.85mm/px · 2 of 125 slices shown]
[im 42/125  soft-tissue]
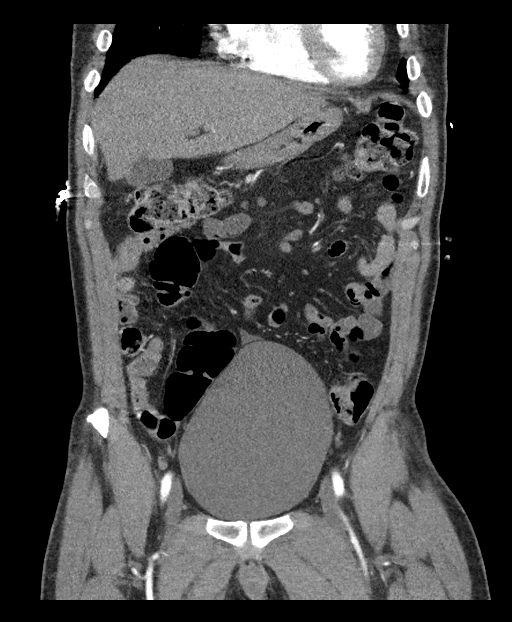
[im 83/125  soft-tissue]
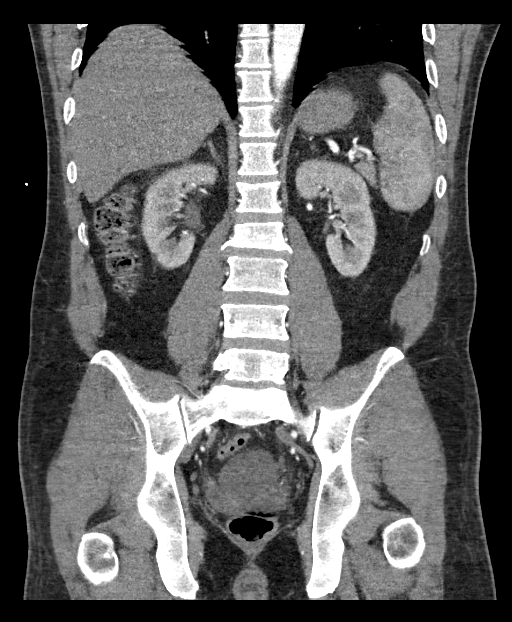

[12 of 46 positions shown; findings below may reference images not displayed]

FINDINGS: VASCULAR

Aorta: Unremarkable course, caliber, contour of the abdominal aorta.
No dissection, aneurysm, or periaortic fluid.

Celiac: No significant atherosclerotic changes at the origin of the
celiac artery. Typical branch pattern of the celiac artery, with
left gastric, common hepatic, and splenic artery identified.

SMA: No significant atherosclerotic changes of the superior
mesenteric artery.

Renals: Renal arteries are patent. Single left renal artery. Two
right renal artery.

IMA: Inferior mesenteric artery is patent. Left colic artery patent.
Superior rectal artery patent.

Right lower extremity:

Unremarkable course, caliber, and contour of the right iliac system.
No aneurysm, dissection, or occlusion. Hypogastric artery is patent.
Anterior and posterior division patent. Common femoral artery
patent. Proximal SFA and profunda femoris patent.

Left lower extremity:

Unremarkable course, caliber, and contour of the left iliac system.
No aneurysm, dissection, or occlusion. Hypogastric artery is patent.
Anterior and posterior division patent. Common femoral artery
patent. Proximal SFA and profunda femoris patent.

Veins: Unremarkable appearance of the venous system.

Review of the MIP images confirms the above findings.

NON-VASCULAR

Lower chest: Unremarkable.

Hepatobiliary: Unremarkable appearance of the liver. Unremarkable
gall bladder.

Pancreas: Unremarkable appearance of the pancreas. No
pericholecystic fluid or inflammatory changes. Unremarkable ductal
system.

Spleen: Unremarkable.

Adrenals/Urinary Tract: Unremarkable appearance of adrenal glands.

Right:

No hydronephrosis. Symmetric perfusion to the left. No
nephrolithiasis. Unremarkable course of the right ureter.

Left:

No hydronephrosis. Symmetric perfusion to the right. No
nephrolithiasis. Unremarkable course of the left ureter.

Distention of the urinary bladder.

Stomach/Bowel: Unremarkable appearance of the stomach. Unremarkable
appearance of small bowel. No evidence of obstruction. Normal
appendix. Colonic diverticula.

There are adjacent surgical clips within the ascending colon
compatible with given history. No evidence of pooling contrast or a
extravasation of contrast to suggest hemorrhage.

Lymphatic: There are small lymph nodes within the
para-aortic/preaortic nodal station along the aorta. There are small
lymph nodes within the base of the mesenteric. Portacaval node
measures 12 mm-59 mm. Node adjacent to pancreatic head measures 17
mm.

Mesenteric: Trace free fluid within the lower abdomen adjacent to
the descending colon and within the mid abdomen adjacent to the
sigmoid.

Reproductive: Unremarkable appearance of the pelvic organs.

Other: No hernia.

Musculoskeletal: No evidence of acute fracture. No bony canal
narrowing. No significant degenerative changes of the hips.
IMPRESSION: No acute finding, with no evidence of gastrointestinal hemorrhage.

Surgical clips in the ascending colon, compatible with the given
history of colonoscopy and clipping.

Nonspecific trace free fluid within the abdomen adjacent to the
descending colon and sigmoid colon. In the absence of trauma where
this could represent mesenteric or occult bowel injury, this is most
likely reactive/inflammatory.

Nonspecific lymph nodes in the preaortic/periaortic nodal stations,
base of the mesentery, and within the upper abdomen. Again, this may
represent inflammatory/reactive changes, however, if there is
concern for lymphoproliferative disorder, recommend correlation with
lab values.

## 2018-07-19 ENCOUNTER — Telehealth: Payer: Self-pay | Admitting: Family Medicine

## 2018-07-19 ENCOUNTER — Ambulatory Visit: Payer: Self-pay
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
## 2018-07-19 NOTE — Telephone Encounter (Signed)
Copied from CRM [id number]. Topic: Appointment Scheduling - Scheduling Inquiry for Clinic >> Jul 19, 2018  4:47 PM Deborha Payment wrote: Reason for CRM: patient wife is calling to schedule an appt with PCP. Patient wife states he has not gone to go to the bathroom in 10 days, Patient is saying he is not right in his mind, he is swollen all over, and keeps asking for a magic pill.  Patients wife is stating that he is asking for help. Also wife is stating he is a heavy drinker. And has been drinking today and everyday.   She believes that patient needs blood work.  I did triage call.  Call back # [phone]

## 2018-07-20 ENCOUNTER — Ambulatory Visit (INDEPENDENT_AMBULATORY_CARE_PROVIDER_SITE_OTHER): Payer: Self-pay

## 2018-07-20 ENCOUNTER — Other Ambulatory Visit: Payer: Self-pay

## 2018-07-20 ENCOUNTER — Ambulatory Visit (INDEPENDENT_AMBULATORY_CARE_PROVIDER_SITE_OTHER): Payer: BC Managed Care – PPO | Admitting: Family Medicine

## 2018-07-20 VITALS — BP 160/90 | HR 85 | Temp 98.7°F | Wt 223.0 lb

## 2018-07-20 DIAGNOSIS — F411 Generalized anxiety disorder: Secondary | ICD-10-CM

## 2018-07-20 DIAGNOSIS — I10 Essential (primary) hypertension: Secondary | ICD-10-CM

## 2018-07-20 DIAGNOSIS — R140 Abdominal distension (gaseous): Secondary | ICD-10-CM

## 2018-07-20 DIAGNOSIS — K5900 Constipation, unspecified: Secondary | ICD-10-CM

## 2018-07-20 LAB — CBC WITH DIFFERENTIAL/PLATELET
Basophils Absolute: 0 10*3/uL (ref 0.0–0.1)
Basophils Relative: 0.5 % (ref 0.0–3.0)
Eosinophils Absolute: 0.1 10*3/uL (ref 0.0–0.7)
Eosinophils Relative: 1.5 % (ref 0.0–5.0)
HCT: 43.9 % (ref 39.0–52.0)
Hemoglobin: 15.2 g/dL (ref 13.0–17.0)
Lymphocytes Relative: 23.4 % (ref 12.0–46.0)
Lymphs Abs: 1.2 10*3/uL (ref 0.7–4.0)
MCHC: 34.6 g/dL (ref 30.0–36.0)
MCV: 97.1 fl (ref 78.0–100.0)
Monocytes Absolute: 0.5 10*3/uL (ref 0.1–1.0)
Monocytes Relative: 8.8 % (ref 3.0–12.0)
Neutro Abs: 3.5 10*3/uL (ref 1.4–7.7)
Neutrophils Relative %: 65.8 % (ref 43.0–77.0)
Platelets: 132 10*3/uL — ABNORMAL LOW (ref 150.0–400.0)
RBC: 4.52 Mil/uL (ref 4.22–5.81)
RDW: 12.8 % (ref 11.5–15.5)
WBC: 5.3 10*3/uL (ref 4.0–10.5)

## 2018-07-20 LAB — COMPREHENSIVE METABOLIC PANEL
ALT: 73 U/L — ABNORMAL HIGH (ref 0–53)
AST: 65 U/L — ABNORMAL HIGH (ref 0–37)
Albumin: 4.7 g/dL (ref 3.5–5.2)
Alkaline Phosphatase: 107 U/L (ref 39–117)
BUN: 14 mg/dL (ref 6–23)
CO2: 27 mEq/L (ref 19–32)
Calcium: 9.8 mg/dL (ref 8.4–10.5)
Chloride: 100 mEq/L (ref 96–112)
Creatinine, Ser: 0.76 mg/dL (ref 0.40–1.50)
GFR: 109.29 mL/min (ref >60.00)
Glucose, Bld: 85 mg/dL (ref 70–99)
Potassium: 4.4 mEq/L (ref 3.5–5.1)
Sodium: 138 mEq/L (ref 135–145)
Total Bilirubin: 1.3 mg/dL — ABNORMAL HIGH (ref 0.2–1.2)
Total Protein: 7.9 g/dL (ref 6.0–8.3)

## 2018-07-20 LAB — TSH: TSH: 1.92 u[IU]/mL (ref 0.35–4.50)

## 2018-07-20 NOTE — Progress Notes (Signed)
Subjective:     Patient ID: Aaron Wilkerson, male   DOB: [birth year], 48 y.o.   MRN: [id number]  HPI  Patient seen for an office evaluation predominantly for constipation.  Patient states has not had a bowel movement now in about 10 days.  This is atypical for him.  He denies any prior issues with constipation.  We had received telephone message yesterday to his wife with concern that he was "swollen all over".  To clarify, she meant his abdomen but not generally swollen.  She did mention that he has been drinking alcohol fairly heavily.  He states he has been drinking 5-6 beers per day.  No fever.  No abdominal pain.  No bloody stools.  He does have history of diverticulosis bleed previously.  No history of recurrence.  He is taken some Citrucel but no stool softeners or laxatives.  He has hypertension history and is on losartan.  Not monitoring blood pressures at home.  Past Medical History:  Diagnosis Date  . Alcohol use disorder, mild, in sustained remission 03/08/2016   He quit after 20 years - 12 beers per day.  Was seen and treated in Rehabilitation facility.   Past Surgical History:  Procedure Laterality Date  . COLONOSCOPY N/A 03/04/2016   Procedure: COLONOSCOPY;  Surgeon: Ruffin FrederickSteven Paul Armbruster, MD;  Location: [facility];  Service: Gastroenterology;  Laterality: N/A;  . ESOPHAGOGASTRODUODENOSCOPY N/A 03/04/2016   Procedure: ESOPHAGOGASTRODUODENOSCOPY (EGD);  Surgeon: Ruffin FrederickSteven Paul Armbruster, MD;  Location: [facility];  Service: Gastroenterology;  Laterality: N/A;  . KNEE SURGERY  2000    reports that he has never smoked. His smokeless tobacco use includes chew. He reports current alcohol use of about 168.0 standard drinks of alcohol per week. He reports that he does not use drugs. family history includes Heart disease in his maternal grandfather and maternal grandmother; Hyperlipidemia in his father; Hypertension in his father and mother. No Known Allergies   Review of Systems   Constitutional: Negative for appetite change and unexpected weight change.  HENT: Negative for trouble swallowing.   Respiratory: Negative for cough and shortness of breath.   Cardiovascular: Negative for chest pain.  Gastrointestinal: Positive for constipation. Negative for abdominal pain, blood in stool, diarrhea, nausea and vomiting.  Genitourinary: Negative for dysuria.  Neurological: Negative for weakness.  Psychiatric/Behavioral: Negative for confusion.       Objective:   Physical Exam Constitutional:      Appearance: Normal appearance.  Neck:     Musculoskeletal: Neck supple.  Pulmonary:     Effort: Pulmonary effort is normal.     Breath sounds: Normal breath sounds.  Abdominal:     General: There is no distension.     Palpations: Abdomen is soft. There is no mass.     Tenderness: There is no abdominal tenderness. There is no guarding or rebound.  Genitourinary:    Comments: Rectal exam reveals no stool in the rectal vault.  No masses palpated Neurological:     Mental Status: He is alert.        Assessment:     #1 severe constipation.  No prior history.  Does not take any opioids or other medications that should be contributing.  No evidence for impaction on exam  #2 hypertension suboptimally controlled and possibly exacerbated by #1  #3 alcohol abuse history  #4 increased anxiety symptoms    Plan:     -Check further labs with CBC, comprehensive metabolic panel, TSH -KUB obtained -Increase water consumption and  goal of at least 25 to 30 g fiber per day -Suggest that he try Senokot-S and MiraLAX.  If these are not working his wife has prescription for lactulose which he can consider as well.  If he is not getting relief with the those to be in touch by tomorrow -Long discussion regarding his alcohol history.  He has history of abuse in the past and has recently started back.  Strongly advocated that he discontinue altogether -Start back sertraline for his  anxiety symptoms  Kristian Covey MD [facility]

## 2018-07-20 NOTE — Telephone Encounter (Signed)
Please contact for appt today - may need to be in-person. Thanks!

## 2018-07-20 NOTE — Patient Instructions (Signed)
Constipation, Adult Constipation is when a person has fewer bowel movements in a week than normal, has difficulty having a bowel movement, or has stools that are dry, hard, or larger than normal. Constipation may be caused by an underlying condition. It may become worse with age if a person takes certain medicines and does not take in enough fluids. Follow these instructions at home: Eating and drinking   Eat foods that have a lot of fiber, such as fresh fruits and vegetables, whole grains, and beans.  Limit foods that are high in fat, low in fiber, or overly processed, such as french fries, hamburgers, cookies, candies, and soda.  Drink enough fluid to keep your urine clear or pale yellow. General instructions  Exercise regularly or as told by your health care provider.  Go to the restroom when you have the urge to go. Do not hold it in.  Take over-the-counter and prescription medicines only as told by your health care provider. These include any fiber supplements.  Practice pelvic floor retraining exercises, such as deep breathing while relaxing the lower abdomen and pelvic floor relaxation during bowel movements.  Watch your condition for any changes.  Keep all follow-up visits as told by your health care provider. This is important. Contact a health care provider if:  You have pain that gets worse.  You have a fever.  You do not have a bowel movement after 4 days.  You vomit.  You are not hungry.  You lose weight.  You are bleeding from the anus.  You have thin, pencil-like stools. Get help right away if:  You have a fever and your symptoms suddenly get worse.  You leak stool or have blood in your stool.  Your abdomen is bloated.  You have severe pain in your abdomen.  You feel dizzy or you faint. This information is not intended to replace advice given to you by your health care provider. Make sure you discuss any questions you have with your health care  provider. Document Released: 10/31/2003 Document Revised: 08/22/2015 Document Reviewed: 07/23/2015 Elsevier Interactive Patient Education  2019 [employer].  Consider Senokot S and/or Miralax and plenty of fluids  Let me know by tomorrow if no success.

## 2018-07-20 NOTE — Telephone Encounter (Signed)
The patient is scheduled to come in today at 11AM

## 2018-07-26 ENCOUNTER — Telehealth: Payer: Self-pay | Admitting: *Deleted

## 2018-07-26 NOTE — Telephone Encounter (Signed)
My recommendation would be that he consider inpatient ETOH detox program- particularly if he has hx of DTs.  If he is willing, would have them contact program such [facility] [place].  These types of programs require that the patient initiate the call (ie we do not make a formal referral).

## 2018-07-26 NOTE — Telephone Encounter (Signed)
Spoke with wife. Informed wife per Dr. Elease Hashimoto he recommends an inpatient ETOH detox program d/t hx of DTs. Such as [facility]. Wife verbalized understanding.

## 2018-07-26 NOTE — Telephone Encounter (Signed)
Copied from [place] [phone]. Topic: General - Inquiry >> Jul 26, 2018  9:47 AM Margot Ables wrote: Reason for CRM: pt wife called stating she missed a call from the office. I do not see notes. Please advise.   Clinic RN  Spoke with wife

## 2018-11-06 ENCOUNTER — Other Ambulatory Visit: Payer: Self-pay | Admitting: Family Medicine

## 2018-12-05 ENCOUNTER — Other Ambulatory Visit: Payer: Self-pay | Admitting: Family Medicine

## 2018-12-06 ENCOUNTER — Telehealth: Payer: Self-pay

## 2018-12-06 NOTE — Telephone Encounter (Signed)
Offer Doxy.  We need to discuss whether he is interested in inpatient treatment vs other .

## 2018-12-06 NOTE — Telephone Encounter (Signed)
Attempted to reach pt, per Doctor would like video visit

## 2018-12-06 NOTE — Telephone Encounter (Signed)
Copied from Page [phone]. Topic: General - Other >> Dec 06, 2018  2:38 PM Leward Quan A wrote: Reason for CRM: Aaron Wilkerson wanted to inform Dr Elease Hashimoto that the patient have finally decided to seek help and that his drinking have decreased tremendously.

## 2018-12-06 NOTE — Telephone Encounter (Signed)
Copied from [place] [id number]. Topic: Quick Communication - Rx Refill/Question >> Dec 06, 2018  2:35 PM Leward Quan A wrote: Medication: sertraline (ZOLOFT) 100 MG tablet   Patient only have one pill left.  Has the patient contacted their pharmacy? Yes.   (Agent: If no, request that the patient contact the pharmacy for the refill.) (Agent: If yes, when and what did the pharmacy advise?)  Preferred Pharmacy (with phone number or street name): [facility], [place] [phone] (Phone) [phone] (Fax)    Agent: Please be advised that RX refills may take up to 3 business days. We ask that you follow-up with your pharmacy.

## 2018-12-12 NOTE — Telephone Encounter (Signed)
Sent pt a mychart message. 

## 2019-04-20 ENCOUNTER — Ambulatory Visit: Payer: Self-pay

## 2019-04-27 ENCOUNTER — Ambulatory Visit: Payer: BC Managed Care – PPO | Attending: Internal Medicine

## 2019-04-27 DIAGNOSIS — Z23 Encounter for immunization: Secondary | ICD-10-CM

## 2019-04-27 NOTE — Progress Notes (Signed)
   Covid-19 Vaccination Clinic  Name:  Aaron Wilkerson    MRN: [id number] DOB: April 10, 1970  04/27/2019  Mr. Aaron Wilkerson was observed post Covid-19 immunization for 15 minutes without incident. He was provided with Vaccine Information Sheet and instruction to access the V-Safe system.   Mr. Aaron Wilkerson was instructed to call 911 with any severe reactions post vaccine: Marland Kitchen Difficulty breathing  . Swelling of face and throat  . A fast heartbeat  . A bad rash all over body  . Dizziness and weakness   Immunizations Administered    Name Date Dose VIS Date Route   Pfizer COVID-19 Vaccine 04/27/2019  2:07 PM 0.3 mL 01/26/2019 Intramuscular   Manufacturer: [employer], [employer]   Lot: [id number]   NDC: [address]-6

## 2019-05-22 ENCOUNTER — Ambulatory Visit: Payer: BC Managed Care – PPO | Attending: Internal Medicine

## 2019-05-22 DIAGNOSIS — Z23 Encounter for immunization: Secondary | ICD-10-CM

## 2019-05-22 NOTE — Progress Notes (Signed)
   Covid-19 Vaccination Clinic  Name:  Aaron Wilkerson    MRN: [id number] DOB: 06-Jul-1970  05/22/2019  Aaron Wilkerson was observed post Covid-19 immunization for 15 minutes without incident. He was provided with Vaccine Information Sheet and instruction to access the V-Safe system.   Aaron Wilkerson was instructed to call 911 with any severe reactions post vaccine: Marland Kitchen Difficulty breathing  . Swelling of face and throat  . A fast heartbeat  . A bad rash all over body  . Dizziness and weakness   Immunizations Administered    Name Date Dose VIS Date Route   Pfizer COVID-19 Vaccine 05/22/2019  1:21 PM 0.3 mL 01/26/2019 Intramuscular   Manufacturer: [employer], [employer]   Lot: [id number]   NDC: [address]-2

## 2019-06-03 ENCOUNTER — Other Ambulatory Visit: Payer: Self-pay | Admitting: Family Medicine

## 2019-09-02 ENCOUNTER — Other Ambulatory Visit: Payer: Self-pay | Admitting: Family Medicine

## 2020-02-26 DIAGNOSIS — Z1152 Encounter for screening for COVID-19: Secondary | ICD-10-CM | POA: Diagnosis not present

## 2020-09-08 ENCOUNTER — Encounter: Payer: Self-pay | Admitting: Gastroenterology

## 2020-10-15 ENCOUNTER — Emergency Department (HOSPITAL_COMMUNITY)
Admission: EM | Admit: 2020-10-15 | Discharge: 2020-10-15 | Disposition: A | Discharge status: Home / Self Care | Payer: BC Managed Care – PPO | Attending: Emergency Medicine | Admitting: Emergency Medicine

## 2020-10-15 ENCOUNTER — Emergency Department (HOSPITAL_COMMUNITY): Payer: BC Managed Care – PPO

## 2020-10-15 ENCOUNTER — Encounter (HOSPITAL_COMMUNITY): Payer: Self-pay | Admitting: *Deleted

## 2020-10-15 ENCOUNTER — Other Ambulatory Visit: Payer: Self-pay

## 2020-10-15 DIAGNOSIS — S299XXA Unspecified injury of thorax, initial encounter: Secondary | ICD-10-CM | POA: Diagnosis not present

## 2020-10-15 DIAGNOSIS — R0600 Dyspnea, unspecified: Secondary | ICD-10-CM | POA: Diagnosis not present

## 2020-10-15 DIAGNOSIS — I10 Essential (primary) hypertension: Secondary | ICD-10-CM | POA: Diagnosis not present

## 2020-10-15 DIAGNOSIS — W010XXA Fall on same level from slipping, tripping and stumbling without subsequent striking against object, initial encounter: Secondary | ICD-10-CM | POA: Diagnosis not present

## 2020-10-15 DIAGNOSIS — S2232XA Fracture of one rib, left side, initial encounter for closed fracture: Secondary | ICD-10-CM | POA: Diagnosis not present

## 2020-10-15 DIAGNOSIS — Z79899 Other long term (current) drug therapy: Secondary | ICD-10-CM | POA: Diagnosis not present

## 2020-10-15 DIAGNOSIS — F17220 Nicotine dependence, chewing tobacco, uncomplicated: Secondary | ICD-10-CM | POA: Diagnosis not present

## 2020-10-15 DIAGNOSIS — S2242XA Multiple fractures of ribs, left side, initial encounter for closed fracture: Secondary | ICD-10-CM | POA: Diagnosis not present

## 2020-10-15 DIAGNOSIS — W19XXXA Unspecified fall, initial encounter: Secondary | ICD-10-CM

## 2020-10-15 MED ORDER — HYDROCODONE-ACETAMINOPHEN 5-325 MG PO TABS
1.0000 | ORAL_TABLET | ORAL | 0 refills | Status: AC | PRN
Start: 2020-10-15 — End: 2020-10-18

## 2020-10-15 MED ORDER — HYDROCODONE-ACETAMINOPHEN 5-325 MG PO TABS
1.0000 | ORAL_TABLET | Freq: Once | ORAL | Status: AC
  Administered 2020-10-15: 1 via ORAL
  Filled 2020-10-15: qty 1

## 2020-10-15 MED ORDER — LIDOCAINE 5 % EX PTCH
1.0000 | MEDICATED_PATCH | CUTANEOUS | Status: DC
  Administered 2020-10-15: 1 via TRANSDERMAL
  Filled 2020-10-15 (×2): qty 1

## 2020-10-15 MED ORDER — LIDOCAINE 5 % EX PTCH: 1.0000 | MEDICATED_PATCH | CUTANEOUS | 0 refills | Status: AC

## 2020-10-15 NOTE — ED Triage Notes (Signed)
Pt complains of left lower rib pain since falling and hitting his upper torso on tub after shower. No loss of consciousness, slipped on wet floor. Not head, neck or back pain.

## 2020-10-15 NOTE — Discharge Instructions (Addendum)
You were provided with medication to help with severe pain, please take 2 tablet every four hour as needed. You may also take ibuprofen for moderate pain.   I have also prescribed lidocaine patches to help with pain control, you may apply 1 of these patches daily and keep in place for the next 12 hours.  If you experience any shortness of breath, worsening pain, worsening symptoms you will need to return to the emergency department.

## 2020-10-15 NOTE — ED Provider Notes (Signed)
[facility]-EMERGENCY DEPT Provider Note   CSN: [id number] Arrival date & time: 10/15/20  0801     History Chief Complaint  Patient presents with   left rib cage pain    Aaron Wilkerson is a 50 y.o. male.  50 y.o male with no PMH presents to the ED with a chief complaint of left rib pain s/p fall. Patient reports stepping out of the bathtub last night, when he suddenly slipped on the wet floor, landing on the left side of his chest in the bathtub. He reports worsening pain throughout the day, and also exacerbated with movement. He did not strike his head, he is not any blood thinners. He has not taken any medication for relieve in symptoms. He denies any LOC, chest pain, shortness of breath or other complaints.   The history is provided by the patient and medical records.      Past Medical History:  Diagnosis Date   Alcohol use disorder, mild, in sustained remission 03/08/2016   He quit after 20 years - 12 beers per day.  Was seen and treated in Rehabilitation facility.    Patient Active Problem List   Diagnosis Date Noted   Anxiety state 05/12/2016   Alcohol use disorder, mild, in sustained remission 03/08/2016   Dizziness    Diverticulosis of colon with hemorrhage    Acute blood loss anemia 03/04/2016   Erosive gastropathy 03/04/2016   Syncope 03/04/2016   Rectal bleeding 03/03/2016   GI bleeding 03/03/2016   Premature ejaculation 06/25/2013   Hypertension 05/19/2013    Past Surgical History:  Procedure Laterality Date   COLONOSCOPY N/A 03/04/2016   Procedure: COLONOSCOPY;  Surgeon: Ruffin Frederick, MD;  Location: [facility];  Service: Gastroenterology;  Laterality: N/A;   ESOPHAGOGASTRODUODENOSCOPY N/A 03/04/2016   Procedure: ESOPHAGOGASTRODUODENOSCOPY (EGD);  Surgeon: Ruffin Frederick, MD;  Location: [facility];  Service: Gastroenterology;  Laterality: N/A;   KNEE SURGERY  2000       Family History  Problem Relation Age of  Onset   Hypertension Mother    Hypertension Father    Hyperlipidemia Father    Heart disease Maternal Grandmother    Heart disease Maternal Grandfather     Social History   Tobacco Use   Smoking status: Never   Smokeless tobacco: Current    Types: Chew  Substance Use Topics   Alcohol use: Yes    Alcohol/week: 168.0 standard drinks    Types: 168 Cans of beer per week   Drug use: No    Home Medications Prior to Admission medications   Medication Sig Start Date End Date Taking? Authorizing Provider  HYDROcodone-acetaminophen (NORCO/VICODIN) 5-325 MG tablet Take 1 tablet by mouth every 4 (four) hours as needed for up to 3 days. 10/15/20 10/18/20 Yes Baelynn Schmuhl, PA-C  lidocaine (LIDODERM) 5 % Place 1 patch onto the skin daily for 7 days. Remove & Discard patch within 12 hours or as directed by MD 10/15/20 10/22/20 Yes Ndeye Tenorio, Leonie Douglas, PA-C  busPIRone (BUSPAR) 15 MG tablet Take one half tablet twice daily for one week and then may titrate up to one whole tablet twice daily. Patient not taking: Reported on 07/20/2018 09/23/17   Kristian Covey, MD  losartan (COZAAR) 50 MG tablet TAKE 1 TABLET BY MOUTH DAILY 11/06/18   Burchette, Elberta Fortis, MD  sertraline (ZOLOFT) 100 MG tablet TAKE 1 TABLET(100 MG) BY MOUTH DAILY 09/03/19   Burchette, Elberta Fortis, MD    Allergies  Patient has no known allergies.  Review of Systems   Review of Systems  Constitutional:  Negative for fatigue.  Respiratory:  Negative for shortness of breath.   Cardiovascular:  Negative for chest pain.  Skin:  Positive for color change and wound.   Physical Exam Updated Vital Signs BP (!) 163/101 (BP Location: Left Arm)   Pulse 99   Temp 98.1 F (36.7 C) (Oral)   Resp 17   Ht 5\' 10"  (1.778 m)   Wt 99.8 kg   SpO2 97%   BMI 31.57 kg/m   Physical Exam Vitals and nursing note reviewed.  Constitutional:      Comments: Appears in pain  HENT:     Head: Normocephalic and atraumatic.     Comments: No visible trauma to the  head or neck.     Mouth/Throat:     Mouth: Mucous membranes are moist.  Eyes:     Pupils: Pupils are equal, round, and reactive to light.  Cardiovascular:     Rate and Rhythm: Normal rate.  Pulmonary:     Effort: Pulmonary effort is normal.     Breath sounds: No wheezing.     Comments: Pain with inspiration.  Abdominal:     General: Abdomen is flat.     Tenderness: There is no abdominal tenderness.  Musculoskeletal:     Cervical back: Normal range of motion and neck supple.  Skin:    General: Skin is warm and dry.     Findings: Bruising present.     Comments: Bruising to the left lower hip.   Neurological:     Mental Status: He is alert and oriented to person, place, and time.    ED Results / Procedures / Treatments   Labs (all labs ordered are listed, but only abnormal results are displayed) Labs Reviewed - No data to display  EKG None  Radiology DG Ribs Unilateral W/Chest Left  Result Date: 10/15/2020 CLINICAL DATA:  Fall, left lower rib pain, dyspnea EXAM: LEFT RIBS AND CHEST - 3+ VIEW COMPARISON:  11/04/2015 FINDINGS: Minimally displaced fracture of the anterior left eighth rib. There is no evidence of pneumothorax or pleural effusion. Both lungs are clear. Heart size and mediastinal contours are within normal limits. IMPRESSION: 1. No acute abnormality of the lungs. 2. Minimally displaced fracture of the anterior left eighth rib. No pneumothorax or pleural effusion. Electronically Signed   By: 11/06/2015 M.D.   On: 10/15/2020 09:19    Procedures Procedures   Medications Ordered in ED Medications  lidocaine (LIDODERM) 5 % 1 patch (1 patch Transdermal Patch Applied 10/15/20 1002)  HYDROcodone-acetaminophen (NORCO/VICODIN) 5-325 MG per tablet 1 tablet (1 tablet Oral Given 10/15/20 1002)    ED Course  I have reviewed the triage vital signs and the nursing notes.  Pertinent labs & imaging results that were available during my care of the patient were reviewed by me  and considered in my medical decision making (see chart for details).  Clinical Course as of 10/15/20 1013  Wed Oct 15, 2020  Oct 17, 2020 DG Ribs Unilateral W/Chest Left [JS]    Clinical Course User Index [JS] 8546, Claude Manges   MDM Rules/Calculators/A&P  Patient here with left lower rib pain status post mechanical fall last night after he slipped in his bathtub.  There is pain to the area exacerbated with movement and rotation at the waist.  He has not taken any medication for improvement in symptoms.  Evaluation he is uncomfortable, there is  pain with palpation along the left lower quadrant, specifically around the rib region, lungs are clear to auscultation without any absent lung sounds.  Bruising noted to the left hip, and left lower back without any bruising along the midline.  No visible signs of trauma to the head or neck.  He is currently not on any blood thinners.  X-ray of his left ribs showed minimally displaced anterior left showed:  1. No acute abnormality of the lungs.  2. Minimally displaced fracture of the anterior left eighth rib. No  pneumothorax or pleural effusion.      9:40 AMCall placed to radiologist Dr. Lauralyn Primes who reported a minimally displaced fracture of the left 8th rib.   Patient was given Norco for pain control, spirometer to help with resting atelectasis.  He will go home on a short course of narcotics, along with lidocaine patches for symptomatic control.  Patient reevaluate me, does report improvement in symptoms after lidocaine patch along with Norco.  He will go home on incentive spirometer, patient appears stable for discharge at this time.  Return precautions discussed at length  Portions of this note were generated with Dragon dictation software. Dictation errors may occur despite best attempts at proofreading.  Final Clinical Impression(s) / ED Diagnoses Final diagnoses:  Closed fracture of one rib of left side, initial encounter    Rx / DC  Orders ED Discharge Orders          Ordered    HYDROcodone-acetaminophen (NORCO/VICODIN) 5-325 MG tablet  Every 4 hours PRN        10/15/20 0948    lidocaine (LIDODERM) 5 %  Every 24 hours        10/15/20 0948             Claude Manges, PA-C 10/15/20 1013    Ernie Avena, MD 10/15/20 1306

## 2020-10-22 ENCOUNTER — Ambulatory Visit: Payer: BC Managed Care – PPO | Admitting: Family Medicine

## 2020-10-22 ENCOUNTER — Other Ambulatory Visit: Payer: Self-pay

## 2020-10-22 VITALS — BP 140/90 | HR 85 | Temp 98.2°F | Wt 214.2 lb

## 2020-10-22 DIAGNOSIS — I10 Essential (primary) hypertension: Secondary | ICD-10-CM

## 2020-10-22 DIAGNOSIS — S2232XD Fracture of one rib, left side, subsequent encounter for fracture with routine healing: Secondary | ICD-10-CM | POA: Diagnosis not present

## 2020-10-22 MED ORDER — LOSARTAN POTASSIUM 50 MG PO TABS: ORAL_TABLET | ORAL | 3 refills | Status: DC

## 2020-10-22 NOTE — Patient Instructions (Signed)
Consider OTC 4% topical lidocaine patch.   Consider setting up complete physical

## 2020-10-22 NOTE — Progress Notes (Signed)
Established Patient Office Visit  Subjective:  Patient ID: Aaron Wilkerson, male    DOB: 16-Jun-1970  Age: 50 y.o. MRN: [id number]  CC:  Chief Complaint  Patient presents with   Hospitalization Follow-up    HPI Aaron Wilkerson presents for hospital follow-up.  He had slipped in his bathroom on 10-15-2020 and fell on a wet floor and landed on his left side.  He noted immediate chest wall pain.  No head injury.  No other injuries reported.  Went to the ER and had minimally displaced left anterior eighth rib fracture.  This was a single rib fracture.  No pneumothorax.  Patient was prescribed 10 Norco but is only taken about 6 of these.  He tries to avoid opioids.  He has fear of addiction.  Previous history of alcohol abuse.  He states his pain has improved over the past week.  He was prescribed lidocaine 5% patch but apparently had difficulty getting this filled at the pharmacy.  He is try to take deep breaths daily.  History of hypertension.  Had been on losartan but currently not taking medication.  He ran out.  Does not monitor blood pressures regularly.  No recent headaches or dizziness.  No chest pains.  Does have chest wall pain as above.  Past Medical History:  Diagnosis Date   Alcohol use disorder, mild, in sustained remission 03/08/2016   He quit after 20 years - 12 beers per day.  Was seen and treated in Rehabilitation facility.    Past Surgical History:  Procedure Laterality Date   COLONOSCOPY N/A 03/04/2016   Procedure: COLONOSCOPY;  Surgeon: Ruffin Frederick, MD;  Location: [facility];  Service: Gastroenterology;  Laterality: N/A;   ESOPHAGOGASTRODUODENOSCOPY N/A 03/04/2016   Procedure: ESOPHAGOGASTRODUODENOSCOPY (EGD);  Surgeon: Ruffin Frederick, MD;  Location: [facility];  Service: Gastroenterology;  Laterality: N/A;   KNEE SURGERY  2000    Family History  Problem Relation Age of Onset   Hypertension Mother    Hypertension Father    Hyperlipidemia Father     Heart disease Maternal Grandmother    Heart disease Maternal Grandfather     Social History   Socioeconomic History   Marital status: Single    Spouse name: Not on file   Number of children: Not on file   Years of education: Not on file   Highest education level: Not on file  Occupational History   Not on file  Tobacco Use   Smoking status: Never   Smokeless tobacco: Current    Types: Chew  Substance and Sexual Activity   Alcohol use: Yes    Alcohol/week: 168.0 standard drinks    Types: 168 Cans of beer per week   Drug use: No   Sexual activity: Not on file  Other Topics Concern   Not on file  Social History Narrative   Not on file   Social Determinants of Health   Financial Resource Strain: Not on file  Food Insecurity: Not on file  Transportation Needs: Not on file  Physical Activity: Not on file  Stress: Not on file  Social Connections: Not on file  Intimate Partner Violence: Not on file    Outpatient Medications Prior to Visit  Medication Sig Dispense Refill   busPIRone (BUSPAR) 15 MG tablet Take one half tablet twice daily for one week and then may titrate up to one whole tablet twice daily. 60 tablet 1   lidocaine (LIDODERM) 5 % Place 1 patch onto the  skin daily for 7 days. Remove & Discard patch within 12 hours or as directed by MD 7 patch 0   losartan (COZAAR) 50 MG tablet TAKE 1 TABLET BY MOUTH DAILY 90 tablet 3   sertraline (ZOLOFT) 100 MG tablet TAKE 1 TABLET(100 MG) BY MOUTH DAILY 30 tablet 0   No facility-administered medications prior to visit.    No Known Allergies  ROS Review of Systems  Constitutional:  Negative for fatigue and unexpected weight change.  Eyes:  Negative for visual disturbance.  Respiratory:  Negative for cough, chest tightness and shortness of breath.   Cardiovascular:  Negative for palpitations and leg swelling.  Neurological:  Negative for dizziness, syncope, weakness, light-headedness and headaches.     Objective:     Physical Exam Constitutional:      Appearance: He is well-developed.  HENT:     Right Ear: External ear normal.     Left Ear: External ear normal.  Eyes:     Pupils: Pupils are equal, round, and reactive to light.  Neck:     Thyroid: No thyromegaly.  Cardiovascular:     Rate and Rhythm: Normal rate and regular rhythm.  Pulmonary:     Effort: Pulmonary effort is normal. No respiratory distress.     Breath sounds: Normal breath sounds. No wheezing or rales.     Comments: Tenderness over the left anterior rib cage around the eighth rib Musculoskeletal:     Cervical back: Neck supple.  Neurological:     Mental Status: He is alert and oriented to person, place, and time.    BP 140/90 (BP Location: Left Arm, Patient Position: Sitting, Cuff Size: Normal)   Pulse 85   Temp 98.2 F (36.8 C) (Oral)   Wt 214 lb 3.2 oz (97.2 kg)   SpO2 98%   BMI 30.73 kg/m  Wt Readings from Last 3 Encounters:  10/22/20 214 lb 3.2 oz (97.2 kg)  10/15/20 220 lb (99.8 kg)  07/20/18 223 lb (101.2 kg)     Health Maintenance Due  Topic Date Due   Pneumococcal Vaccine 73-60 Years old (1 - PCV) Never done   HIV Screening  Never done   Hepatitis C Screening  Never done   TETANUS/TDAP  02/15/2018   COVID-19 Vaccine (3 - Booster for Pfizer series) 10/22/2019   Zoster Vaccines- Shingrix (1 of 2) Never done   INFLUENZA VACCINE  09/15/2020    There are no preventive care reminders to display for this patient.  Lab Results  Component Value Date   TSH 1.92 07/20/2018   Lab Results  Component Value Date   WBC 5.3 07/20/2018   HGB 15.2 07/20/2018   HCT 43.9 07/20/2018   MCV 97.1 07/20/2018   PLT 132.0 (L) 07/20/2018   Lab Results  Component Value Date   NA 138 07/20/2018   K 4.4 07/20/2018   CO2 27 07/20/2018   GLUCOSE 85 07/20/2018   BUN 14 07/20/2018   CREATININE 0.76 07/20/2018   BILITOT 1.3 (H) 07/20/2018   ALKPHOS 107 07/20/2018   AST 65 (H) 07/20/2018   ALT 73 (H) 07/20/2018   PROT  7.9 07/20/2018   ALBUMIN 4.7 07/20/2018   CALCIUM 9.8 07/20/2018   ANIONGAP 8 12/25/2016   GFR 109.29 07/20/2018   No results found for: CHOL No results found for: HDL No results found for: LDLCALC No results found for: TRIG No results found for: CHOLHDL No results found for: OQHU7M    Assessment & Plan:  Problem List Items Addressed This Visit       Unprioritized   Hypertension - Primary   Relevant Medications   losartan (COZAAR) 50 MG tablet   Other Visit Diagnoses     Closed fracture of one rib of left side with routine healing, subsequent encounter         -Patient has hydrocodone to take for severe pain but prefers not to take any further opioids -Over-the-counter analgesics as needed -We did suggest he consider 4% lidocaine patch topically -Continue to use spirometry regularly -Start back losartan 50 mg daily -Set up complete physical  Meds ordered this encounter  Medications   losartan (COZAAR) 50 MG tablet    Sig: TAKE 1 TABLET BY MOUTH DAILY    Dispense:  90 tablet    Refill:  3    Follow-up: No follow-ups on file.    Evelena Peat, MD

## 2021-10-26 ENCOUNTER — Other Ambulatory Visit: Payer: Self-pay | Admitting: Family Medicine

## 2021-10-27 ENCOUNTER — Telehealth: Payer: Self-pay | Admitting: Family Medicine

## 2021-10-27 MED ORDER — LOSARTAN POTASSIUM 50 MG PO TABS: ORAL_TABLET | ORAL | 0 refills | Status: DC

## 2021-10-27 NOTE — Telephone Encounter (Signed)
Pt wife called to get Rx refill on busPIRone (BUSPAR) 15 MG tablet (she doesn't know if this is his anxiety meds) and osartan (COZAAR) 50 MG tablet sent to   [id number] [place], [place] - [address] AT [facility] OF [facility] & [address] Phone:  [phone]  Fax:  [phone]     Please advise

## 2021-10-27 NOTE — Telephone Encounter (Signed)
Patient has been scheduled for CPE on 09/15

## 2021-10-30 ENCOUNTER — Encounter: Payer: Self-pay | Admitting: Family Medicine

## 2021-10-30 ENCOUNTER — Ambulatory Visit (INDEPENDENT_AMBULATORY_CARE_PROVIDER_SITE_OTHER): Payer: Self-pay | Admitting: Family Medicine

## 2021-10-30 VITALS — BP 142/90 | HR 85 | Temp 98.0°F | Ht 70.0 in | Wt 217.0 lb

## 2021-10-30 DIAGNOSIS — Z23 Encounter for immunization: Secondary | ICD-10-CM

## 2021-10-30 DIAGNOSIS — Z0000 Encounter for general adult medical examination without abnormal findings: Secondary | ICD-10-CM

## 2021-10-30 MED ORDER — BUSPIRONE HCL 15 MG PO TABS: ORAL_TABLET | ORAL | 11 refills | Status: AC

## 2021-10-30 MED ORDER — LOSARTAN POTASSIUM 50 MG PO TABS: ORAL_TABLET | ORAL | 3 refills | Status: DC

## 2021-10-30 NOTE — Progress Notes (Signed)
Established Patient Office Visit  Subjective   Patient ID: Aaron Wilkerson, male    DOB: Feb 10, 1971  Age: 51 y.o. MRN: [id number]  Chief Complaint  Patient presents with   Annual Exam    HPI   Aaron Wilkerson is seen for physical exam.  He has history of hypertension, diverticulosis with prior significant GI bleed, past history of alcohol abuse, chronic intermittent anxiety. Overall doing fairly well.  Works full-time with a company doing Lobbyist work.  Spends time frequently on the weekends helping his mother and father who have a house on [place].  He does have a history of hypertension and just apparently started back his losartan yesterday after running out recently.  Has taken BuSpar as needed in the past for anxiety symptoms and requesting refill  Health maintenance reviewed  -No history of shingles vaccine -Colonoscopy due 2028 -Tetanus given today -He had flu vaccine through work couple days ago  Family history-both parents with history of hypertension.  Father has hyperlipidemia.  He has a brother who is alive and no significant health history.  Social history-he is single.  Does not smoke but uses dipping tobacco.  Does drink some but rare binge drinking.  Usually drinks about 4 to 7 days/week.  Works with a company doing Lobbyist work  Past Medical History:  Diagnosis Date   Alcohol use disorder, mild, in sustained remission 03/08/2016   He quit after 20 years - 12 beers per day.  Was seen and treated in Rehabilitation facility.   Past Surgical History:  Procedure Laterality Date   COLONOSCOPY N/A 03/04/2016   Procedure: COLONOSCOPY;  Surgeon: Ruffin Frederick, MD;  Location: [facility];  Service: Gastroenterology;  Laterality: N/A;   ESOPHAGOGASTRODUODENOSCOPY N/A 03/04/2016   Procedure: ESOPHAGOGASTRODUODENOSCOPY (EGD);  Surgeon: Ruffin Frederick, MD;  Location: [facility];  Service: Gastroenterology;  Laterality: N/A;   KNEE SURGERY  2000     reports that he has never smoked. His smokeless tobacco use includes chew. He reports current alcohol use of about 168.0 standard drinks of alcohol per week. He reports that he does not use drugs. family history includes Heart disease in his maternal grandfather and maternal grandmother; Hyperlipidemia in his father; Hypertension in his father and mother. No Known Allergies   Review of Systems  Constitutional:  Negative for chills, fever, malaise/fatigue and weight loss.  HENT:  Negative for hearing loss.   Eyes:  Negative for blurred vision and double vision.  Respiratory:  Negative for cough and shortness of breath.   Cardiovascular:  Negative for chest pain, palpitations and leg swelling.  Gastrointestinal:  Negative for abdominal pain, blood in stool, constipation and diarrhea.  Genitourinary:  Negative for dysuria.  Skin:  Negative for rash.  Neurological:  Negative for dizziness, speech change, seizures, loss of consciousness and headaches.  Psychiatric/Behavioral:  Negative for depression.       Objective:     BP (!) 142/90 (BP Location: Left Arm, Patient Position: Sitting, Cuff Size: Normal)   Pulse 85   Temp 98 F (36.7 C) (Oral)   Ht 5\' 10"  (1.778 m)   Wt 217 lb (98.4 kg)   SpO2 99%   BMI 31.14 kg/m    Physical Exam Constitutional:      General: He is not in acute distress.    Appearance: He is well-developed.  HENT:     Head: Normocephalic and atraumatic.     Right Ear: External ear normal.     Left Ear:  External ear normal.     Mouth/Throat:     Mouth: Mucous membranes are moist.     Pharynx: Oropharynx is clear.  Eyes:     Conjunctiva/sclera: Conjunctivae normal.     Pupils: Pupils are equal, round, and reactive to light.  Neck:     Thyroid: No thyromegaly.  Cardiovascular:     Rate and Rhythm: Normal rate and regular rhythm.     Heart sounds: Normal heart sounds. No murmur heard. Pulmonary:     Effort: No respiratory distress.     Breath sounds: No  wheezing or rales.  Abdominal:     General: Bowel sounds are normal. There is no distension.     Palpations: Abdomen is soft. There is no mass.     Tenderness: There is no abdominal tenderness. There is no guarding or rebound.  Musculoskeletal:     Cervical back: Normal range of motion and neck supple.  Lymphadenopathy:     Cervical: No cervical adenopathy.  Skin:    Findings: No rash.  Neurological:     Mental Status: He is alert and oriented to person, place, and time.     Cranial Nerves: No cranial nerve deficit.      No results found for any visits on 10/30/21.    The ASCVD Risk score (Arnett DK, et al., 2019) failed to calculate for the following reasons:   Cannot find a previous HDL lab   Cannot find a previous total cholesterol lab    Assessment & Plan:   Problem List Items Addressed This Visit   None Visit Diagnoses     Physical exam    -  Primary   Relevant Orders   Basic metabolic panel   Lipid panel   CBC with Differential/Platelet   Hepatic function panel   TSH   PSA   Need for Tdap vaccination       Relevant Orders   Tdap vaccine greater than or equal to 51yo IM (Completed)     -Flu vaccine already given through work -Tdap given today -We discussed Shingrix vaccine and he will check with insurance company -Refill losartan for 1 year -Check screening labs as above -Discontinue oral tobacco use -Blood pressure up a bit today but he just started back the losartan yesterday.  Monitor closely  No follow-ups on file.    Evelena Peat, MD

## 2021-10-30 NOTE — Patient Instructions (Signed)
Consider Shingrix (shingles) vaccine and check on insurance coverage first.

## 2022-11-19 ENCOUNTER — Other Ambulatory Visit: Payer: Self-pay | Admitting: Family Medicine

## 2022-11-26 ENCOUNTER — Other Ambulatory Visit: Payer: Self-pay | Admitting: Family Medicine

## 2022-12-26 ENCOUNTER — Other Ambulatory Visit: Payer: Self-pay | Admitting: Family Medicine

## 2023-12-21 ENCOUNTER — Ambulatory Visit: Payer: Self-pay

## 2023-12-21 ENCOUNTER — Encounter: Payer: Self-pay | Admitting: Family Medicine

## 2023-12-21 ENCOUNTER — Ambulatory Visit: Payer: Self-pay | Admitting: Family Medicine

## 2023-12-21 VITALS — BP 162/90 | HR 86 | Temp 97.9°F | Wt 195.8 lb

## 2023-12-21 DIAGNOSIS — H938X2 Other specified disorders of left ear: Secondary | ICD-10-CM

## 2023-12-21 DIAGNOSIS — I10 Essential (primary) hypertension: Secondary | ICD-10-CM

## 2023-12-21 MED ORDER — PREDNISONE 20 MG PO TABS: ORAL_TABLET | ORAL | 0 refills | Status: AC

## 2023-12-21 MED ORDER — LOSARTAN POTASSIUM 50 MG PO TABS
ORAL_TABLET | ORAL | 3 refills | Status: AC
Start: 2023-12-21 — End: ?

## 2023-12-21 NOTE — Patient Instructions (Addendum)
 Set up complete physical- if possible in the next two weeks so that we can reassess ear as well    Follow up sooner for any increased redness, ear swelling, or warmth/pain.

## 2023-12-21 NOTE — Telephone Encounter (Signed)
 FYI Only or Action Required?: FYI only for provider: appointment scheduled on 12/21/23.  Patient was last seen in primary care on 10/30/2021 by Micheal Wolm ORN, MD.  Called Nurse Triage reporting Otalgia.  Symptoms began several days ago.  Interventions attempted: Nothing.  Symptoms are: gradually worsening.  Triage Disposition: See HCP Within 4 Hours (Or PCP Triage)  Patient/caregiver understands and will follow disposition?:  Reason for Disposition  [1] SEVERE pain (e.g., excruciating) and [2] not improved 2 hours after pain medicine (e.g., acetaminophen  or ibuprofen)  Answer Assessment - Initial Assessment Questions Left ear pain and edema. Pt thought he may have possibly been bitten by a spider or suspects shingles. Denies rash or fever. Pain radiates to his face.   1. LOCATION: Which ear is involved?     Left ear 2. ONSET: When did the ear pain start?      4 days 3. SEVERITY: How bad is the pain?  (Scale 1-10; mild, moderate or severe)     Severe 5. FEVER: Do you have a fever? If Yes, ask: What is your temperature, how was it measured, and when did it start?     Denies  Protocols used: Earache-A-AH  Copied from CRM [id number]. Topic: Clinical - Red Word Triage >> Dec 21, 2023  8:07 AM Aaron Wilkerson wrote: Aaron Wilkerson that prompted transfer to Nurse Triage: Left ear extreme pain and swelling and moving towards his eye.Neck is in horrible pain.  Possible shingles.

## 2023-12-21 NOTE — Progress Notes (Signed)
 Established Patient Office Visit  Subjective   Patient ID: Aaron Wilkerson, male    DOB: 12-08-1970  Age: 53 y.o. MRN: [id number]  Chief Complaint  Patient presents with   Ear Problem    HPI   Aaron Wilkerson is seen with left ear swelling which came on relatively acutely last Thursday.  He noted significant pruritus.  Minimal if any tenderness.  He initially thought this may be some sort of bite.  He also entertained possibility of shingles though he has not seen any blister.  No hearing changes.  No facial nerve weakness.  He denies any systemic fever or chills.  He has hypertension history poorly controlled and has been basically off his losartan  for many months.  He is requesting refill today.  Past history of excessive alcohol use.  Overdue for physical.  Past Medical History:  Diagnosis Date   Alcohol use disorder, mild, in sustained remission 03/08/2016   He quit after 20 years - 12 beers per day.  Was seen and treated in Rehabilitation facility.   Past Surgical History:  Procedure Laterality Date   COLONOSCOPY N/A 03/04/2016   Procedure: COLONOSCOPY;  Surgeon: Elspeth Deward Naval, MD;  Location: [facility];  Service: Gastroenterology;  Laterality: N/A;   ESOPHAGOGASTRODUODENOSCOPY N/A 03/04/2016   Procedure: ESOPHAGOGASTRODUODENOSCOPY (EGD);  Surgeon: Elspeth Deward Naval, MD;  Location: [facility];  Service: Gastroenterology;  Laterality: N/A;   KNEE SURGERY  2000    reports that he has never smoked. His smokeless tobacco use includes chew. He reports current alcohol use of about 168.0 standard drinks of alcohol per week. He reports that he does not use drugs. family history includes Heart disease in his maternal grandfather and maternal grandmother; Hyperlipidemia in his father; Hypertension in his father and mother. No Known Allergies  Review of Systems  Constitutional:  Negative for chills, fever and malaise/fatigue.  Eyes:  Negative for blurred vision.  Respiratory:   Negative for shortness of breath.   Cardiovascular:  Negative for chest pain.  Skin:  Positive for itching.  Neurological:  Negative for dizziness, weakness and headaches.      Objective:     BP (!) 162/90 (BP Location: Left Arm, Cuff Size: Normal)   Pulse 86   Temp 97.9 F (36.6 C) (Oral)   Wt 195 lb 12.8 oz (88.8 kg)   SpO2 97%   BMI 28.09 kg/m  BP Readings from Last 3 Encounters:  12/21/23 (!) 162/90  10/30/21 (!) 142/90  10/22/20 140/90   Wt Readings from Last 3 Encounters:  12/21/23 195 lb 12.8 oz (88.8 kg)  10/30/21 217 lb (98.4 kg)  10/22/20 214 lb 3.2 oz (97.2 kg)      Physical Exam Vitals reviewed.  Constitutional:      General: He is not in acute distress.    Appearance: He is not ill-appearing.  HENT:     Ears:     Comments: He has some diffuse swelling of the left external ear involving the auricle.  Mild warmth.  Nontender.  No pustules.  No vesicles.  Ear canal reveals minimal cerumen otherwise clear Cardiovascular:     Rate and Rhythm: Normal rate and regular rhythm.  Pulmonary:     Effort: Pulmonary effort is normal.     Breath sounds: Normal breath sounds. No wheezing or rales.  Musculoskeletal:     Cervical back: Neck supple.  Lymphadenopathy:     Cervical: No cervical adenopathy.  Neurological:     Mental Status: He is  alert.      No results found for any visits on 12/21/23.    The ASCVD Risk score (Arnett DK, et al., 2019) failed to calculate for the following reasons:   Cannot find a previous HDL lab   Cannot find a previous total cholesterol lab    Assessment & Plan:   #1 hypertension poorly controlled.  Patient currently off medication.  Refill losartan .  Stressed low-sodium diet and alcohol in moderation.  Set up complete physical in a couple weeks and reassess at that time  #2 acute swelling left external ear.  No evidence for shingles.  No evidence for any pustules.  Areas essentially nontender and more pruritic which suggest  possible allergic reaction.  Doubt polychondritis.  He has no history of known relapsing polychondritis.  Start prednisone  20 mg 2 tablets daily for 7 days and reassess in a couple weeks and follow-up sooner for any progressive redness, warmth or other concerns No follow-ups on file.    Wolm Scarlet, MD

## 2024-01-04 ENCOUNTER — Ambulatory Visit: Payer: Self-pay | Admitting: Family Medicine
